# Patient Record
Sex: Female | Born: 1970 | Race: Black or African American | Hispanic: No | Marital: Single
Health system: Midwestern US, Academic
[De-identification: ages and names within clinical notes are randomized; demographics above are authoritative.]

---

## 2019-04-23 NOTE — Telephone Encounter (Signed)
WANTS TO BE A NPV FOR DR Durwin Nora, SAW YOU ON [employer]

## 2019-04-26 NOTE — Telephone Encounter (Signed)
Left message for patient to call and schedule appointment for Dr Durwin Nora. MD approved.

## 2019-04-26 NOTE — Telephone Encounter (Signed)
This is fine to schedule

## 2019-04-27 NOTE — Telephone Encounter (Signed)
Left message on patient voicemail to call and schedule a new patient appointment with Dr Durwin Nora.

## 2019-05-17 ENCOUNTER — Ambulatory Visit: Payer: PRIVATE HEALTH INSURANCE

## 2019-05-17 NOTE — Unmapped (Signed)
left message  for patient to call for rescheduling and how appointent has been canceled option Thursdayor Friday at the end of the day if needed

## 2019-05-21 ENCOUNTER — Ambulatory Visit: Admit: 2019-05-21 | Discharge: 2019-06-02 | Payer: PRIVATE HEALTH INSURANCE

## 2019-05-21 ENCOUNTER — Other Ambulatory Visit: Admit: 2019-05-21 | Payer: PRIVATE HEALTH INSURANCE

## 2019-05-21 DIAGNOSIS — Z0000 Encounter for general adult medical examination without abnormal findings: Secondary | ICD-10-CM

## 2019-05-21 LAB — COMPREHENSIVE METABOLIC PANEL, SERUM
ALT: 12 U/L (ref 7–52)
AST (SGOT): 18 U/L (ref 13–39)
Albumin: 4.4 g/dL (ref 3.5–5.7)
Alkaline Phosphatase: 118 U/L (ref 36–125)
Anion Gap: 6 mmol/L (ref 3–16)
BUN: 10 mg/dL (ref 7–25)
CO2: 30 mmol/L (ref 21–33)
Calcium: 11.1 mg/dL (ref 8.6–10.3)
Chloride: 104 mmol/L (ref 98–110)
Creatinine: 0.78 mg/dL (ref 0.60–1.30)
Glucose: 98 mg/dL (ref 70–100)
Osmolality, Calculated: 289 mOsm/kg (ref 278–305)
Potassium: 4 mmol/L (ref 3.5–5.3)
Sodium: 140 mmol/L (ref 133–146)
Total Bilirubin: 1.5 mg/dL (ref 0.0–1.5)
Total Protein: 6.7 g/dL (ref 6.4–8.9)
eGFR AA CKD-EPI: >90 See note.
eGFR NONAA CKD-EPI: 89 See note.

## 2019-05-21 LAB — CBC
Hematocrit: 40.7 % (ref 35.0–45.0)
Hemoglobin: 13.2 g/dL (ref 11.7–15.5)
MCH: 31.9 pg (ref 27.0–33.0)
MCHC: 32.5 g/dL (ref 32.0–36.0)
MCV: 98 fL (ref 80.0–100.0)
MPV: 10.6 fL (ref 7.5–11.5)
Platelets: 173 10*3/uL (ref 140–400)
RBC: 4.15 10*6/uL (ref 3.80–5.10)
RDW: 13.4 % (ref 11.0–15.0)
WBC: 4.7 10*3/uL (ref 3.8–10.8)

## 2019-05-21 LAB — HPV HIGH RISK WITH GENOTYPING
HPV DNA High Risk Oth: NEGATIVE
HPV Genotype 16: NEGATIVE
HPV Genotype 18: NEGATIVE

## 2019-05-21 LAB — HEMOGLOBIN A1C: Hemoglobin A1C: 5.9 % (ref 4.0–5.6)

## 2019-05-21 LAB — LIPID PANEL
Cholesterol, Total: 187 mg/dL (ref 0–200)
HDL: 85 mg/dL (ref 60–92)
LDL Cholesterol: 90 mg/dL
Triglycerides: 61 mg/dL (ref 10–149)

## 2019-05-21 LAB — HEPATITIS C ANTIBODY: HCV Ab: NONREACTIVE

## 2019-05-21 LAB — HIV 1+2 ANTIBODY/ANTIGEN WITH REFLEX: HIV 1+2 AB/AGN: NONREACTIVE

## 2019-05-21 NOTE — Unmapped (Addendum)
Mammogram [id number])       DASH Eating Plan  DASH stands for Dietary Approaches to Stop Hypertension. The DASH eating plan is a healthy eating plan that has been shown to reduce high blood pressure (hypertension). It may also reduce your risk for type 2 diabetes, heart disease, and stroke. The DASH eating plan may also help with weight loss.  What are tips for following this plan?  General guidelines  ?? Avoid eating more than 2,300 mg (milligrams) of salt (sodium) a day. If you have hypertension, you may need to reduce your sodium intake to 1,500 mg a day.  ?? Limit alcohol intake to no more than 1 drink a day for nonpregnant women and 2 drinks a day for men. One drink equals 12 oz of beer, 5 oz of wine, or 1?? oz of hard liquor.  ?? Work with your health care provider to maintain a healthy body weight or to lose weight. Ask what an ideal weight is for you.  ?? Get at least 30 minutes of exercise that causes your heart to beat faster (aerobic exercise) most days of the week. Activities may include walking, swimming, or biking.  ?? Work with your health care provider or diet and nutrition specialist (dietitian) to adjust your eating plan to your individual calorie needs.  Reading food labels  ?? Check food labels for the amount of sodium per serving. Choose foods with less than 5 percent of the Daily Value of sodium. Generally, foods with less than 300 mg of sodium per serving fit into this eating plan.  ?? To find whole grains, look for the word whole as the first word in the ingredient list.  Shopping  ?? Buy products labeled as low-sodium or no salt added.  ?? Buy fresh foods. Avoid canned foods and premade or frozen meals.  Cooking  ?? Avoid adding salt when cooking. Use salt-free seasonings or herbs instead of table salt or sea salt. Check with your health care provider or pharmacist before using salt substitutes.  ?? Do not fry foods. Cook foods using healthy methods such as baking, boiling, grilling, and  broiling instead.  ?? Cook with heart-healthy oils, such as olive, canola, soybean, or sunflower oil.  Meal planning    ?? Eat a balanced diet that includes:  ?? 5 or more servings of fruits and vegetables each day. At each meal, try to fill half of your plate with fruits and vegetables.  ?? Up to 6-8 servings of whole grains each day.  ?? Less than 6 oz of lean meat, poultry, or fish each day. A 3-oz serving of meat is about the same size as a deck of cards. One egg equals 1 oz.  ?? 2 servings of low-fat dairy each day.  ?? A serving of nuts, seeds, or beans 5 times each week.  ?? Heart-healthy fats. Healthy fats called Omega-3 fatty acids are found in foods such as flaxseeds and coldwater fish, like sardines, salmon, and mackerel.  ?? Limit how much you eat of the following:  ?? Canned or prepackaged foods.  ?? Food that is high in trans fat, such as fried foods.  ?? Food that is high in saturated fat, such as fatty meat.  ?? Sweets, desserts, sugary drinks, and other foods with added sugar.  ?? Full-fat dairy products.  ?? Do not salt foods before eating.  ?? Try to eat at least 2 vegetarian meals each week.  ?? Eat more home-cooked food and less   restaurant, buffet, and fast food.  ?? When eating at a restaurant, ask that your food be prepared with less salt or no salt, if possible.  What foods are recommended?  The items listed may not be a complete list. Talk with your dietitian about what dietary choices are best for you.  Grains  Whole-grain or whole-wheat bread. Whole-grain or whole-wheat pasta. Brown rice. Oatmeal. Quinoa. Bulgur. Whole-grain and low-sodium cereals. Pita bread. Low-fat, low-sodium crackers. Whole-wheat flour tortillas.  Vegetables  Fresh or frozen vegetables (raw, steamed, roasted, or grilled). Low-sodium or reduced-sodium tomato and vegetable juice. Low-sodium or reduced-sodium tomato sauce and tomato paste. Low-sodium or reduced-sodium canned vegetables.  Fruits  All fresh, dried, or frozen fruit. Canned  fruit in natural juice (without added sugar).  Meat and other protein foods  Skinless chicken or turkey. Ground chicken or turkey. Pork with fat trimmed off. Fish and seafood. Egg whites. Dried beans, peas, or lentils. Unsalted nuts, nut butters, and seeds. Unsalted canned beans. Lean cuts of beef with fat trimmed off. Low-sodium, lean deli meat.  Dairy  Low-fat (1%) or fat-free (skim) milk. Fat-free, low-fat, or reduced-fat cheeses. Nonfat, low-sodium ricotta or cottage cheese. Low-fat or nonfat yogurt. Low-fat, low-sodium cheese.  Fats and oils  Soft margarine without trans fats. Vegetable oil. Low-fat, reduced-fat, or light mayonnaise and salad dressings (reduced-sodium). Canola, safflower, olive, soybean, and sunflower oils. Avocado.  Seasoning and other foods  Herbs. Spices. Seasoning mixes without salt. Unsalted popcorn and pretzels. Fat-free sweets.  What foods are not recommended?  The items listed may not be a complete list. Talk with your dietitian about what dietary choices are best for you.  Grains  Baked goods made with fat, such as croissants, muffins, or some breads. Dry pasta or rice meal packs.  Vegetables  Creamed or fried vegetables. Vegetables in a cheese sauce. Regular canned vegetables (not low-sodium or reduced-sodium). Regular canned tomato sauce and paste (not low-sodium or reduced-sodium). Regular tomato and vegetable juice (not low-sodium or reduced-sodium). Pickles. Olives.  Fruits  Canned fruit in a light or heavy syrup. Fried fruit. Fruit in cream or butter sauce.  Meat and other protein foods  Fatty cuts of meat. Ribs. Fried meat. Bacon. Sausage. Bologna and other processed lunch meats. Salami. Fatback. Hotdogs. Bratwurst. Salted nuts and seeds. Canned beans with added salt. Canned or smoked fish. Whole eggs or egg yolks. Chicken or turkey with skin.  Dairy  Whole or 2% milk, cream, and half-and-half. Whole or full-fat cream cheese. Whole-fat or sweetened yogurt. Full-fat cheese.  Nondairy creamers. Whipped toppings. Processed cheese and cheese spreads.  Fats and oils  Butter. Stick margarine. Lard. Shortening. Ghee. Bacon fat. Tropical oils, such as coconut, palm kernel, or palm oil.  Seasoning and other foods  Salted popcorn and pretzels. Onion salt, garlic salt, seasoned salt, table salt, and sea salt. Worcestershire sauce. Tartar sauce. Barbecue sauce. Teriyaki sauce. Soy sauce, including reduced-sodium. Steak sauce. Canned and packaged gravies. Fish sauce. Oyster sauce. Cocktail sauce. Horseradish that you find on the shelf. Ketchup. Mustard. Meat flavorings and tenderizers. Bouillon cubes. Hot sauce and Tabasco sauce. Premade or packaged marinades. Premade or packaged taco seasonings. Relishes. Regular salad dressings.  Where to find more information:  ?? National Heart, Lung, and Blood Institute: www.nhlbi.nih.gov  ?? American Heart Association: www.heart.org  Summary  ?? The DASH eating plan is a healthy eating plan that has been shown to reduce high blood pressure (hypertension). It may also reduce your risk for type 2 diabetes, heart   disease, and stroke.  ?? With the DASH eating plan, you should limit salt (sodium) intake to 2,300 mg a day. If you have hypertension, you may need to reduce your sodium intake to 1,500 mg a day.  ?? When on the DASH eating plan, aim to eat more fresh fruits and vegetables, whole grains, lean proteins, low-fat dairy, and heart-healthy fats.  ?? Work with your health care provider or diet and nutrition specialist (dietitian) to adjust your eating plan to your individual calorie needs.
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
## 2020-03-17 NOTE — Unmapped (Signed)
[phone] (home)     Patient has been notified to go to urgent care for eval.  Her response was OK

## 2020-03-17 NOTE — Unmapped (Signed)
Aniceto Boss called in requesting an antibiotic for a possible sinus infection. Pt states that she has had them in the past and these symptoms are very similar. Pt with sinus pressure, nasal congestion and yellow nasal drainage. See assessment below.     Pt advised that no appt are available at this time. Pt advised that a message would be sent over to the clinic. Pt advised that if symptoms worsen or does not hear back from clinic that is best she seen an Urgent care for symptoms.    Please call pt back at: [phone]      Reason for Disposition  ??? Using nasal washes and pain medicine > 24 hours and sinus pain (lower forehead, cheekbone, or eye) persists    Answer Assessment - Initial Assessment Questions  1. LOCATION: Where does it hurt?       Around the nose,  2. ONSET: When did the sinus pain start?  (e.g., hours, days)       2 days ago  3. SEVERITY: How bad is the pain?   (Scale 1-10; mild, moderate or severe)    - MILD (1-3): doesn't interfere with normal activities     - MODERATE (4-7): interferes with normal activities (e.g., work or school) or awakens from sleep    - SEVERE (8-10): excruciating pain and patient unable to do any normal activities         6/10  4. RECURRENT SYMPTOM: Have you ever had sinus problems before? If so, ask: When was the last time? and What happened that time?       Yes, and states feels similar   5. NASAL CONGESTION: Is the nose blocked? If so, ask, Can you open it or must you breathe through the mouth?      One nare is blocked at a time   6. NASAL DISCHARGE: Do you have discharge from your nose? If so ask, What color?      Clear to yellow   7. FEVER: Do you have a fever? If so, ask: What is it, how was it measured, and when did it start?       Denies   8. OTHER SYMPTOMS: Do you have any other symptoms? (e.g., sore throat, cough, earache, difficulty breathing)      Back teeth pain, sore throat   9. PREGNANCY: Is there any chance you are pregnant? When  was your last menstrual period?      Denies    Protocols used: SINUS PAIN AND CONGESTION-A-[facility]

## 2020-07-11 NOTE — Unmapped (Signed)
[phone] (home)   No relevant phone numbers on file.         Called patient to give reminder that mammogram is overdue and [facility] Zenaida Niece will be at [facility] from 1-3 pm on Wednesday 07/12/2020.    There was no answer, so left a message asking for call back.     To schedule call [phone].

## 2021-07-02 IMAGING — OT DXA BONE DENSITY
2 series · 2 of 2 positions shown · non-contrast
Comparison: none

REASON FOR EXAM: Evaluate bone density.

RISK FACTORS:  None
PRIOR EXAMS:  None
METHOD:  Scans of the spine and hip were performed using dual energy X-ray densitometry (DXA).

[Series 1: — · 1 of 1 slices shown (1 of 2)]
[im 1/1]
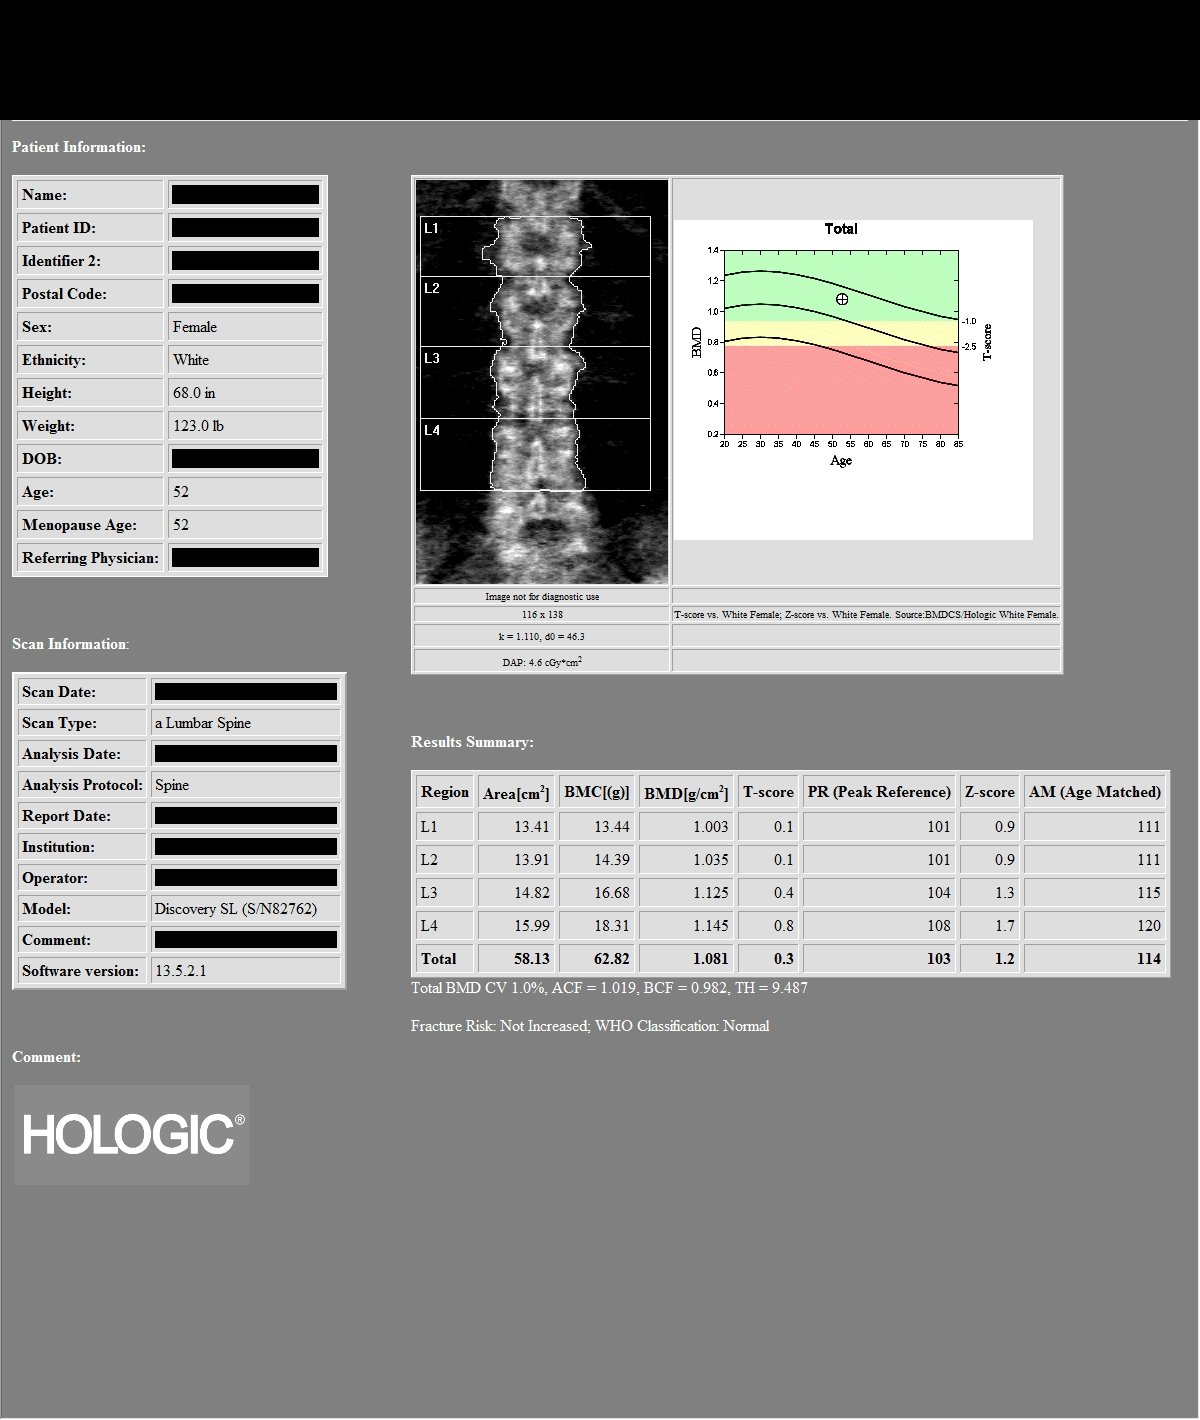

[Series 2: — · left · 1 of 1 slices shown (2 of 2)]
[im 1/1]
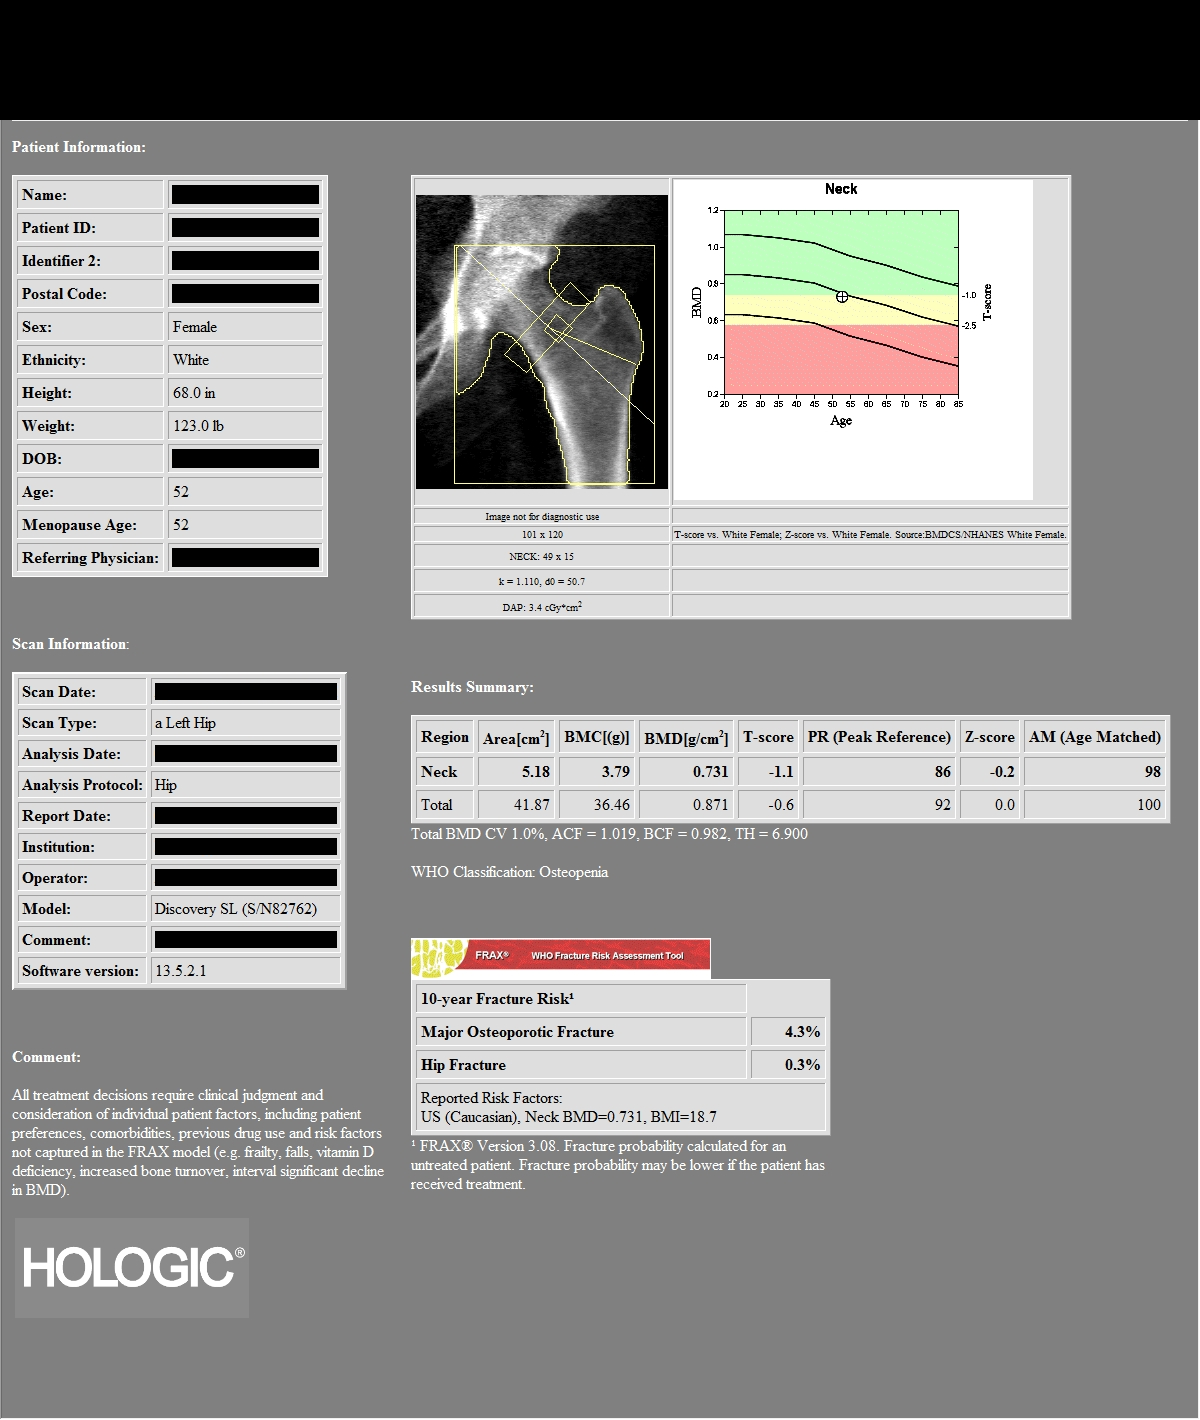

[2 of 2 positions shown; findings below may reference images not displayed]

IMPRESSION: As defined by World Health Organization, the patient meets the criteria for OSTEOPENIA based on left hip T-scores.

PATIENT DEMOGRAPHICS:  50 years old White  Female.
FINDINGS: 1.    Review of scanogram images shows no factor invalidating scan results.  

2.    The lumbar spine exam using L1-L4 regions shows average Bone Mineral Density is 1.081 gm/cm2 of Hydroxyapatite.  The T-score (comparing patient with a young adult group) is 0.3 standard deviations ABOVE mean. The Z-score (comparing patient with an age-matched group) is 1.2 standard deviations ABOVE mean.

3.  The left hip exam using femoral neck region of interest shows average Bone Mineral Density is 0.731 gm/cm2 of Hydroxyapatite. The T-score (comparing patient with a young adult group) is 1.1 standard deviations BELOW mean. The Z-score (comparing patient with an age-matched group) is 0.2 standard deviations BELOW mean.

According to the World Health Organization risk assessment tool (FRAX) for osteopenia only, which uses the femoral neck T score and includes other patient risk factors for fracture, the patient has a 10-year absolute risk of hip fracture of 0.3% and 10-year absolute risk fracture for any major fracture of 4.3%. Clinicians judgment and/or patient preferences may indicate treatment for people with 10-year fracture probabilities above or below these levels.

RECOMMENDATIONS:  The patient states that she is not taking supplements on a regular basis.  The patient should continue being a nonsmoker and regular exercise to patient tolerance would be of benefit.  The patient is currently not taking prescribed medication for prevention of bone loss.  According to criteria established by the National Osteoporosis Foundation, the patient DOES NOT meet the current indications for prescribed medical therapy.  The National Osteoporosis Foundation now recommends followup DXA scanning every two years in patients at risk regardless of whether the patient is undergoing pharmacological treatment.

World Health Organization criteria for diagnosis, please see link below.

[URL]

## 2021-07-02 IMAGING — MG MAMMO SCRN BIL W/CAD TOMO
8 series · 8 of 24 positions shown · non-contrast
Comparison: The present examination has been compared to prior imaging studies.

Images Obtained from [facility]
INDICATION: Screening.
TECHNIQUE: Bilateral 2-D digital screening mammogram was performed followed by 3-D tomosynthesis.  Current study was also evaluated with a computer aided detection (CAD) system.
MAMMOGRAM FINDINGS:
The breasts are almost entirely fatty.
No suspicious abnormality is seen in either breast.  There are no significant changes from the prior study.

[R MLO]
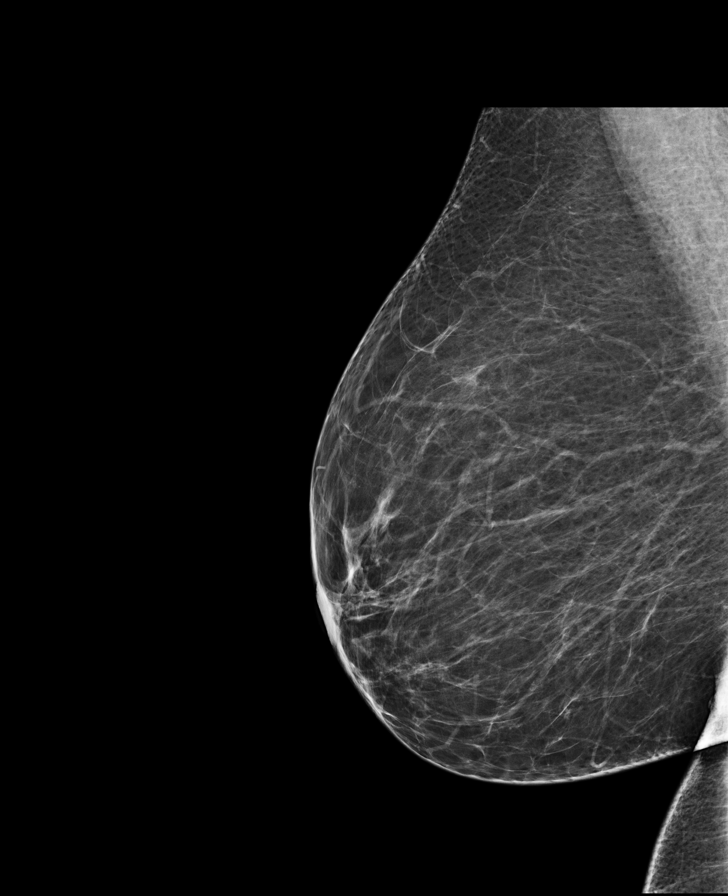

[L CC]
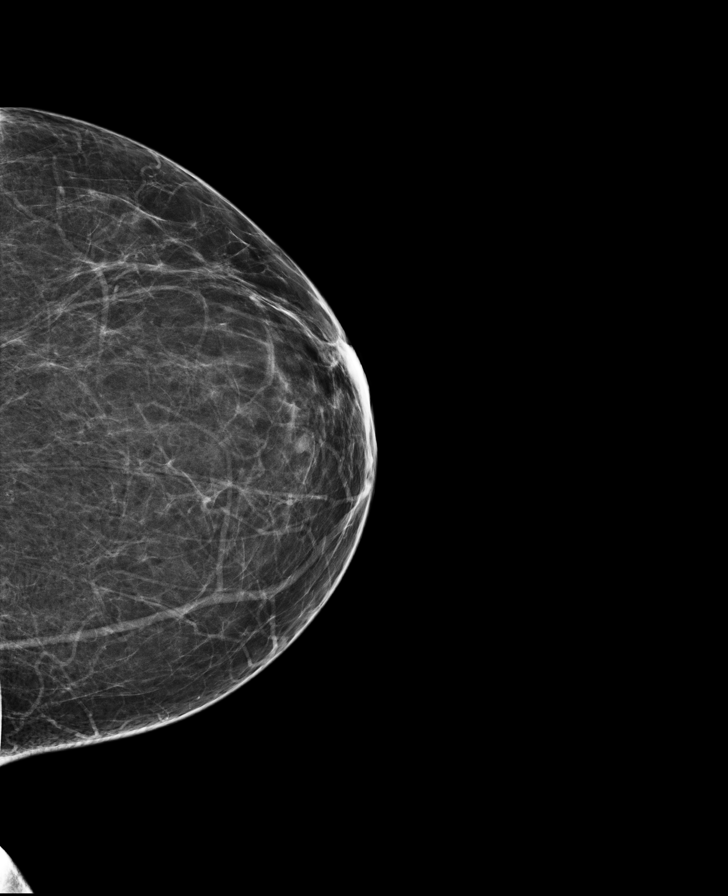

[R CC]
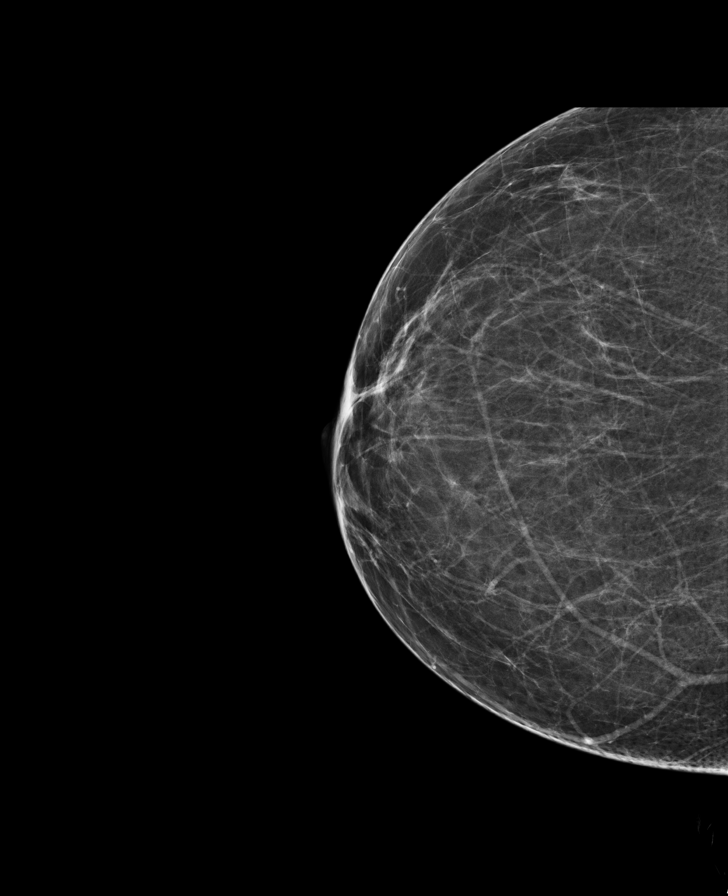

[L MLO]
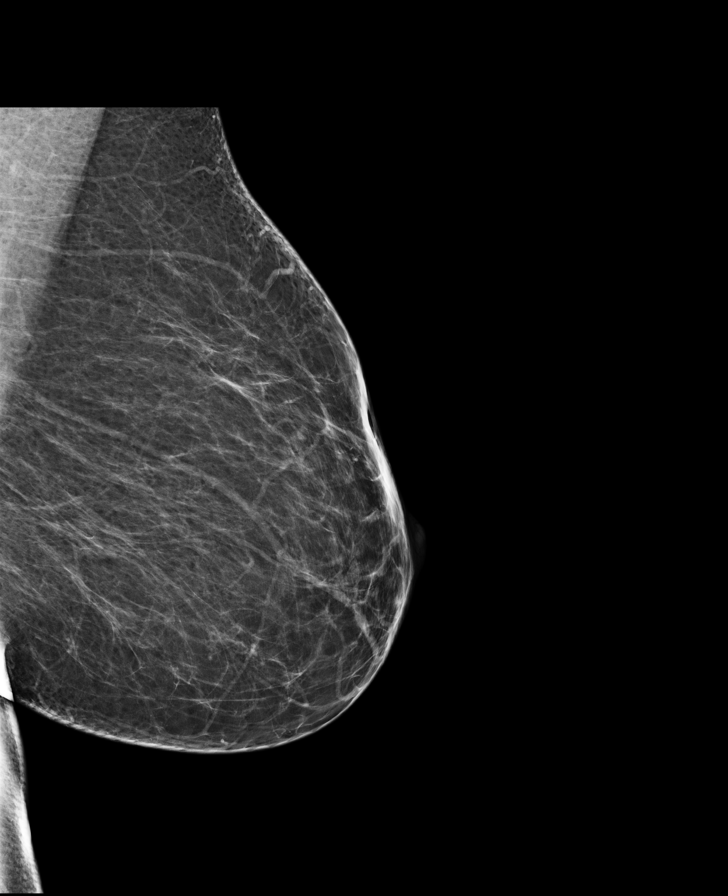

[R CC tomo · tomo slice 33/66.0]
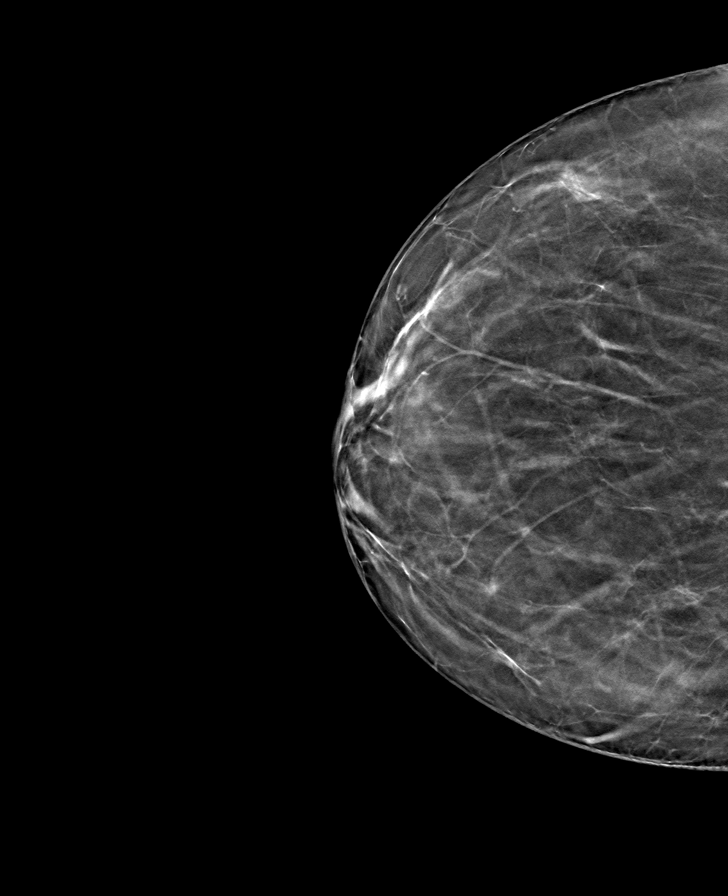

[L MLO tomo · tomo slice 39/77.0]
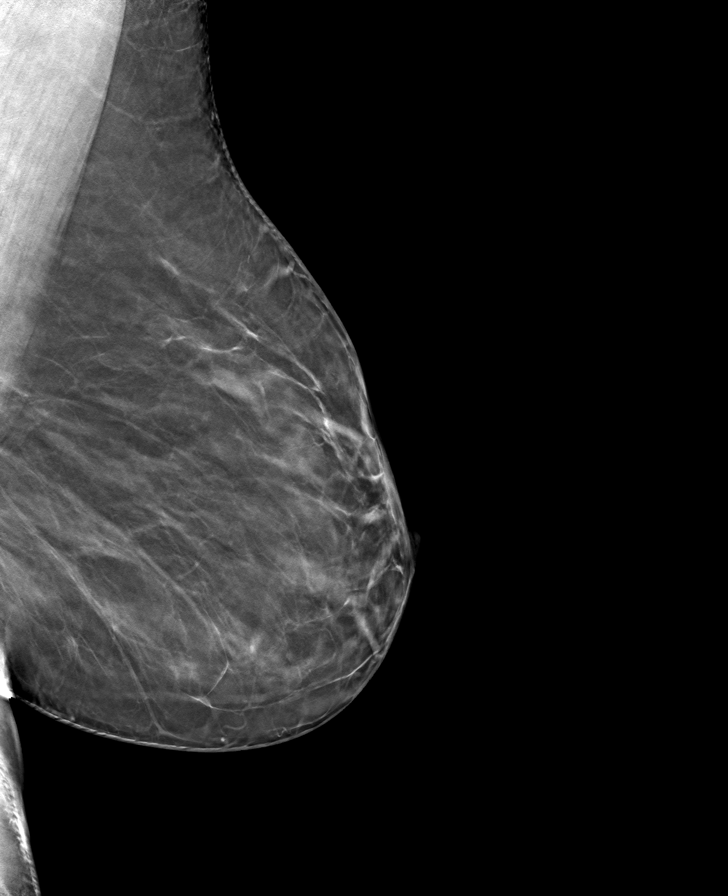

[R MLO tomo · tomo slice 40/79.0]
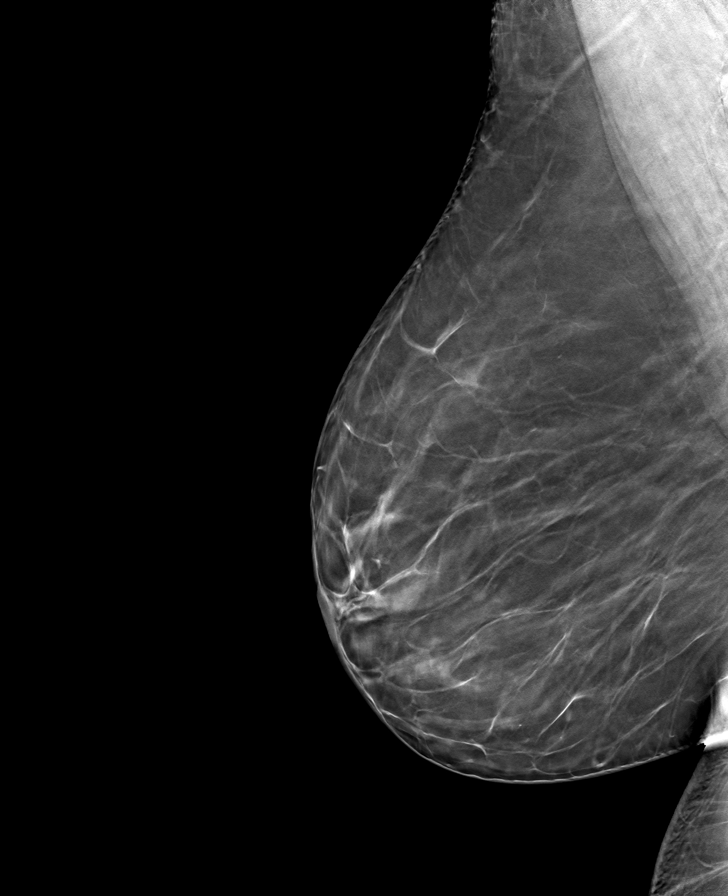

[L CC tomo · tomo slice 33/65.0]
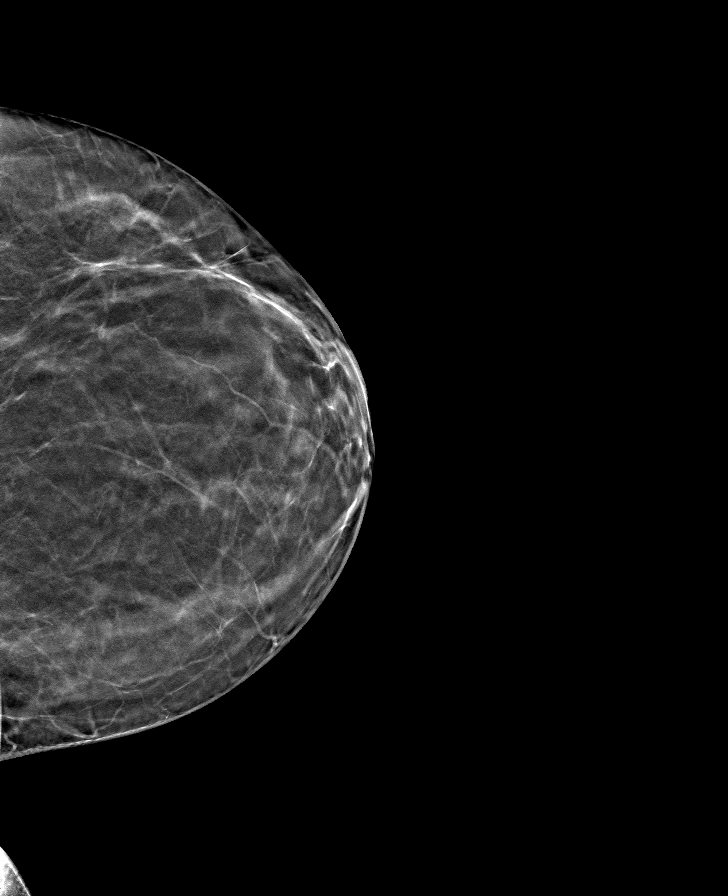

[8 of 24 positions shown; findings below may reference images not displayed]

IMPRESSION: There is no mammographic evidence of malignancy.
Screening mammogram recommended in 1 year.
BI-RADS Category 1: Negative

## 2021-07-02 IMAGING — MR MRI BRAIN WITH IAC WO/W CONTRAST
10 of 13 series · 34 of 48 positions shown · IV contrast (20cc prohance)
Comparison: None.

HISTORY: 50-year-old female with sensorineural hearing loss, unilateral, right ear, with unrestricted hearing on the contralateral side.
TECHNIQUE: Multiplanar, multisequence MRI images of the brain are obtained prior to and following intravenous contrast. IAC protocol was performed.

CONTRAST: 20 mL of ProHance

[Series 1: bSSFP · axial · 4.0mm · 0.94mm/px · z∈[-20,+121]mm · 4 of 25 slices shown]
[im 1/25]
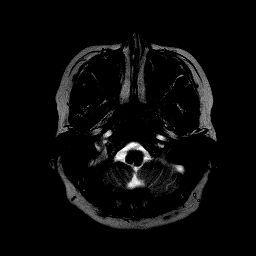
[im 9/25]
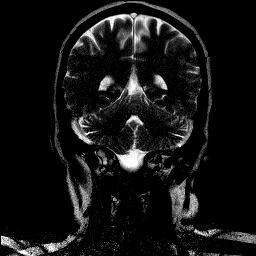
[im 17/25]
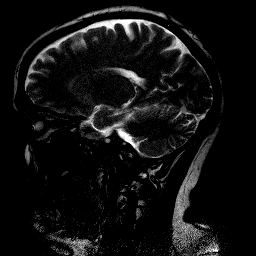
[im 25/25]
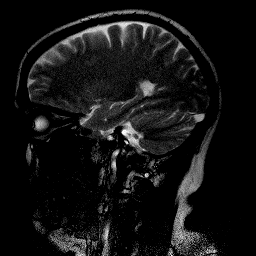

[Series 2: t1_sag · sagittal · 5.0mm · 0.75mm/px · 3 of 22 slices shown]
[im 1/22]
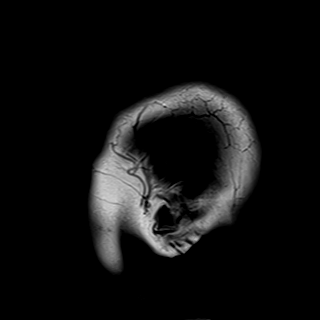
[im 11/22]
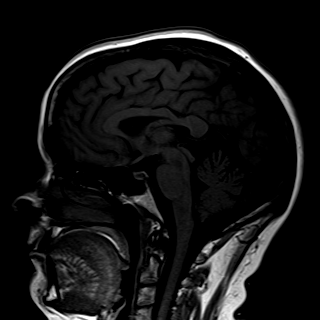
[im 22/22]
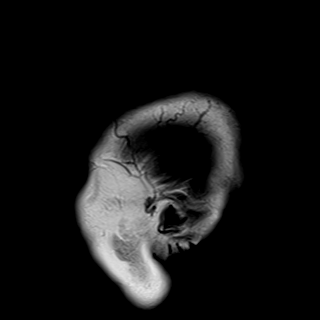

[Series 3: flair_axial_fs · axial · 5.0mm · 0.45mm/px · z∈[-48,+95]mm · 3 of 24 slices shown]
[im 1/24]
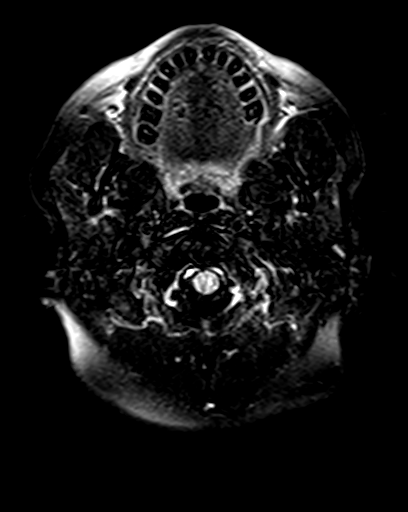
[im 12/24]
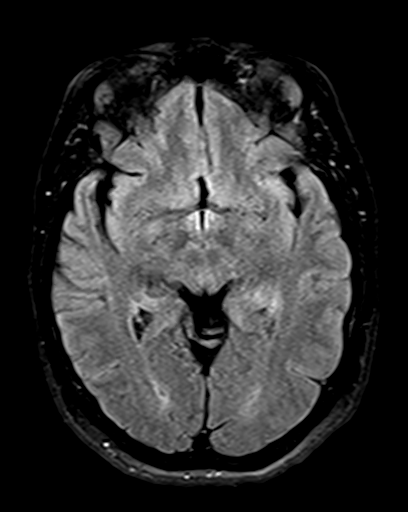
[im 24/24]
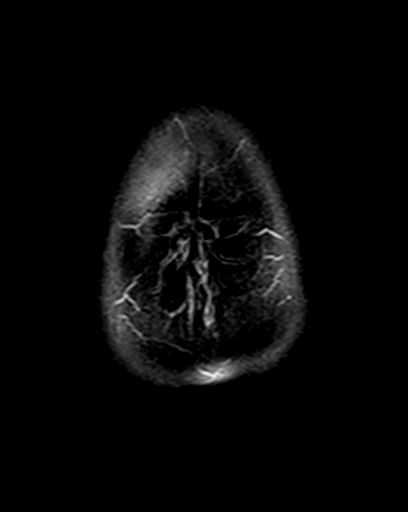

[Series 4: DWI · axial · 5.0mm · 1.26mm/px · z∈[-48,+95]mm · 3 of 24 slices shown (1 of 2)]
[im 1/24]
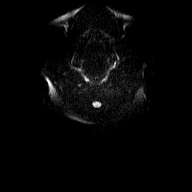
[im 12/24]
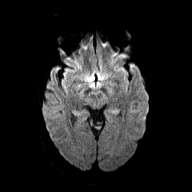
[im 24/24]
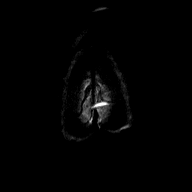

[Series 5: DWI · axial · 5.0mm · 1.26mm/px · z∈[-48,+95]mm · 3 of 24 slices shown (2 of 2)]
[im 1/24]
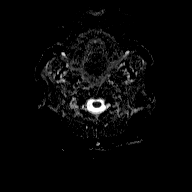
[im 12/24]
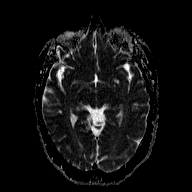
[im 24/24]
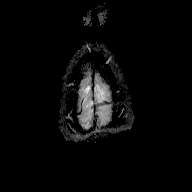

[Series 6: t1_axial · axial · 5.0mm · 0.72mm/px · z∈[-48,+95]mm · 3 of 24 slices shown]
[im 1/24]
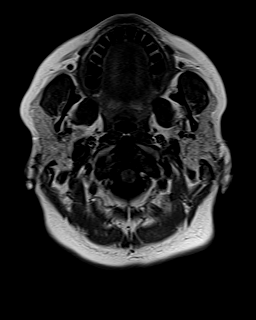
[im 12/24]
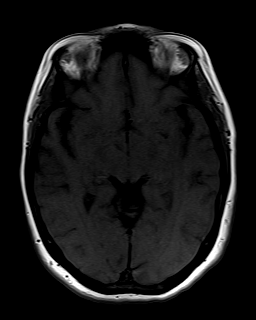
[im 24/24]
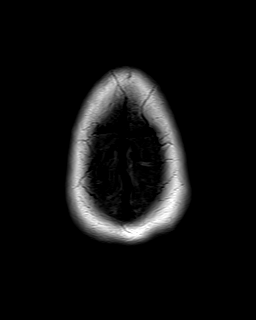

[Series 7: flash_axial · axial · 5.0mm · 0.45mm/px · z∈[-48,+95]mm · 3 of 24 slices shown]
[im 1/24]
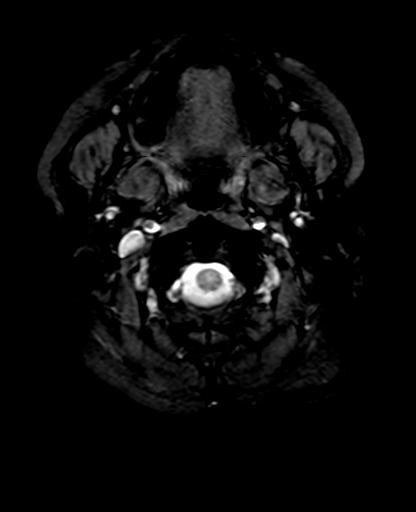
[im 12/24]
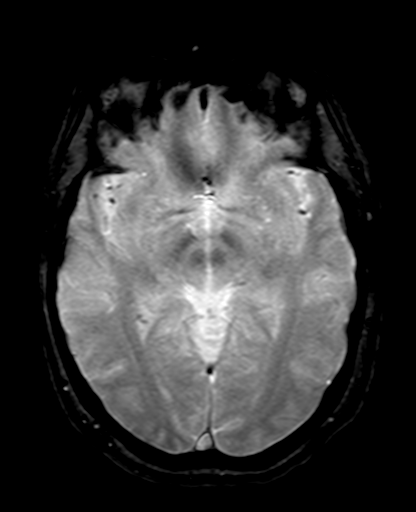
[im 24/24]
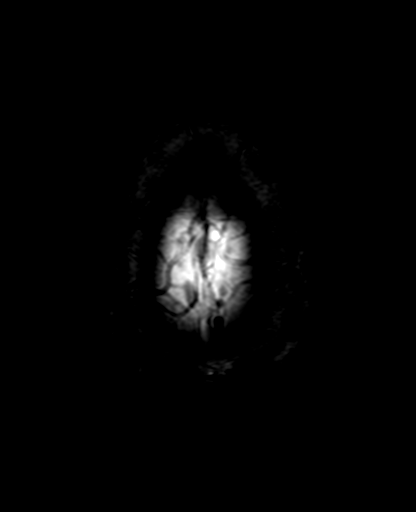

[Series 8: t2_axial_fs · axial · 0.8mm · 0.37mm/px · z∈[-21,+6]mm · 5 of 36 slices shown]
[im 1/36]
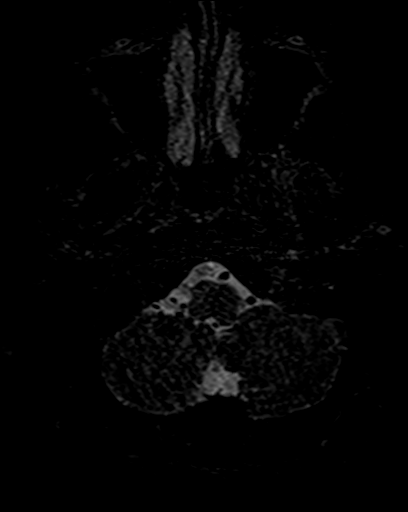
[im 9/36]
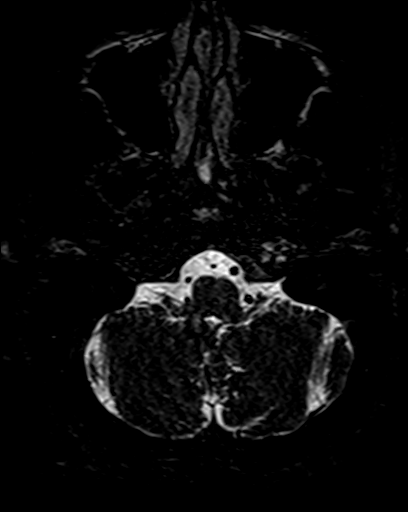
[im 18/36]
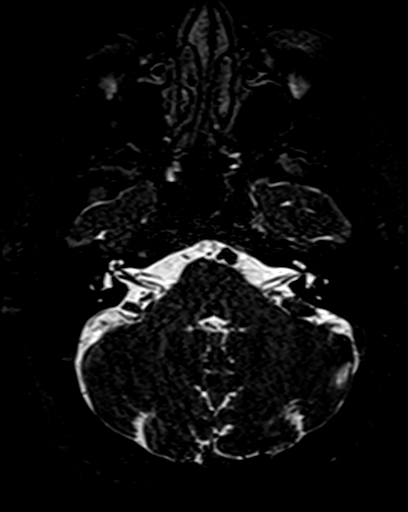
[im 27/36]
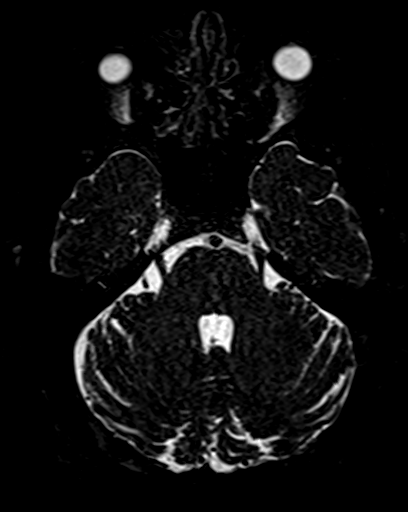
[im 36/36]
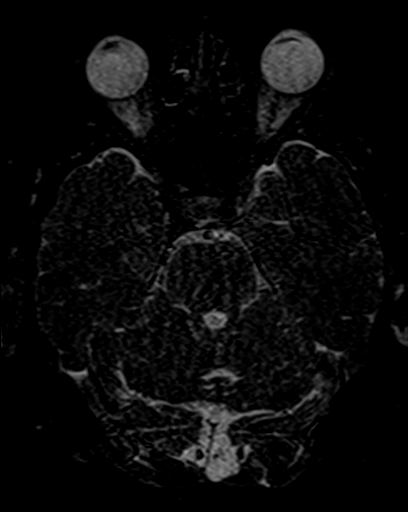

[Series 9: t2_fs_cor_reformat · coronal · 1.5mm · 0.33mm/px · 3 of 20 slices shown]
[im 1/20]
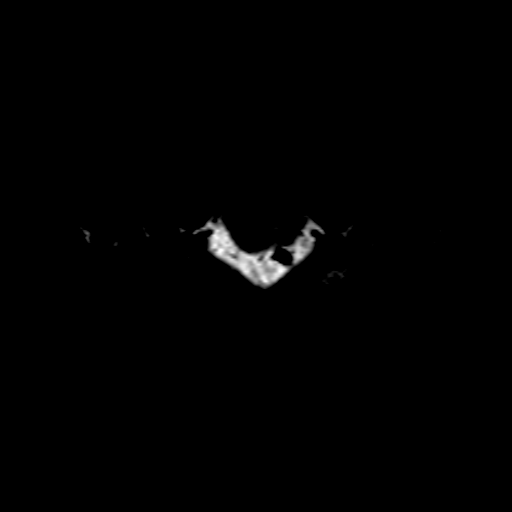
[im 10/20]
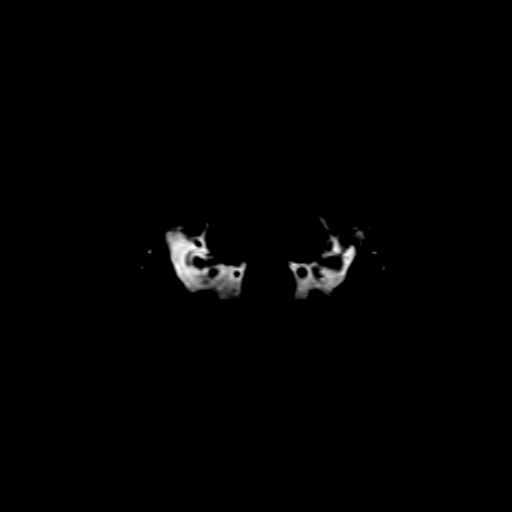
[im 20/20]
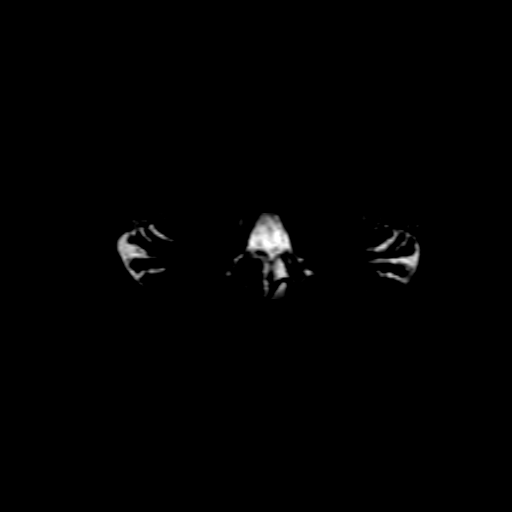

[Series 10: t1_vibe_axial_fs · axial · 1.0mm · 0.39mm/px · z∈[-25,+1]mm · 4 of 36 slices shown]
[im 1/36]
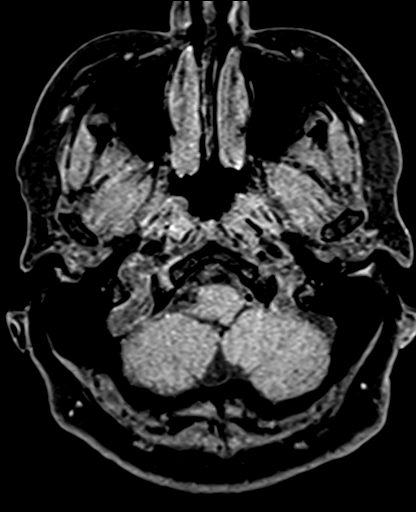
[im 9/36]
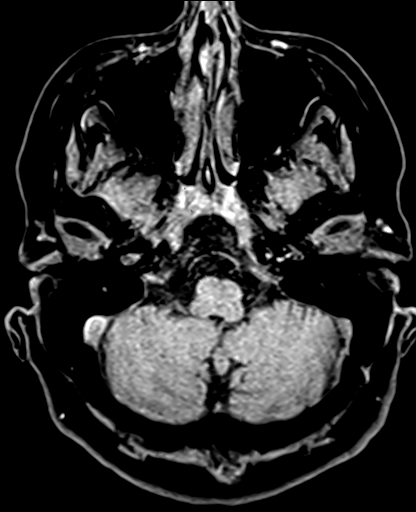
[im 18/36]
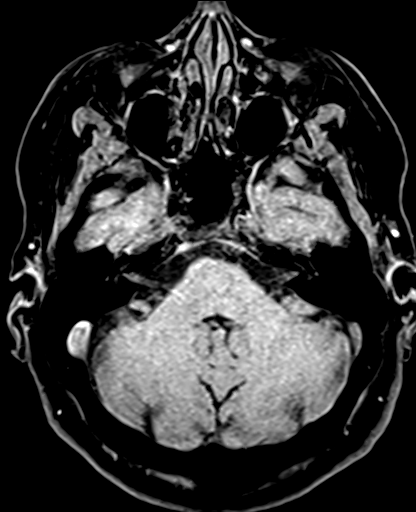
[im 27/36]
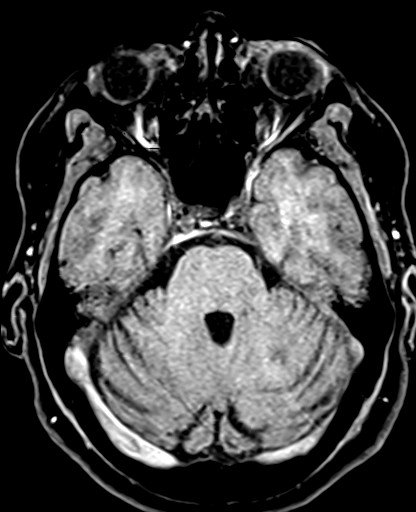

[34 of 48 positions shown; findings below may reference images not displayed]

FINDINGS: EXTERNAL AND MIDDLE EAR CAVITIES: Normal in signal without presence of fluid.

INNER EAR STRUCTURES: Normal in architecture and signal. No abnormal enhancement.

INTERNAL AUDITORY CANAL: No masses or gross nerve thickening.

BRAIN PARENCHYMA: No acute infarct. Supratentorial white matter T2/FLAIR hyperintensities are nonspecific but statistically most likely to be related to chronic microangiopathic/senescent changes. No abnormal intracranial enhancement.

VENTRICULAR SYSTEM: No hydrocephalus or midline shift.

EXTRA-AXIAL SPACES: No intra- or extra-axial fluid collections.

VASCULAR STRUCTURES: Expected major intracranial flow voids are present.

PARANASAL SINUSES AND MASTOID AIR CELLS: Predominantly clear
IMPRESSION: No visualized etiology for hearing loss.

## 2022-01-02 IMAGING — MR MRI KNEE RT WO CONTRAST
4 of 5 series · 16 of 40 positions shown · non-contrast
Comparison: None available.

Images Obtained from [facility]
INDICATION: Pain in right knee
TECHNIQUE: Multiplanar, multisequence imaging of the right knee was performed without contrast.

[Series 2: t2_axial_fs · axial · 4.0mm · 0.23mm/px · z∈[-63,+42]mm · 7 of 26 slices shown]
[im 1/26]
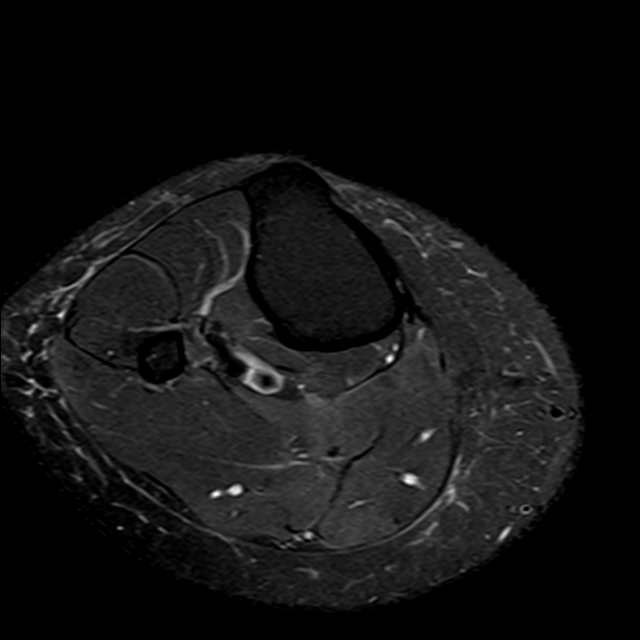
[im 4/26]
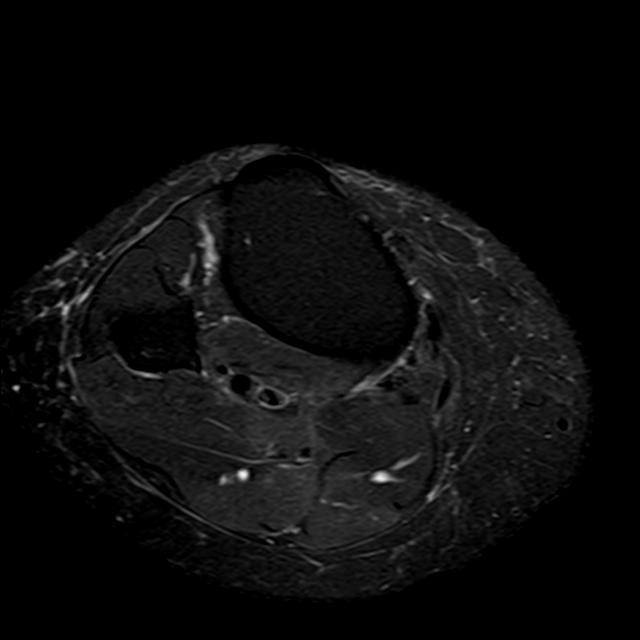
[im 8/26]
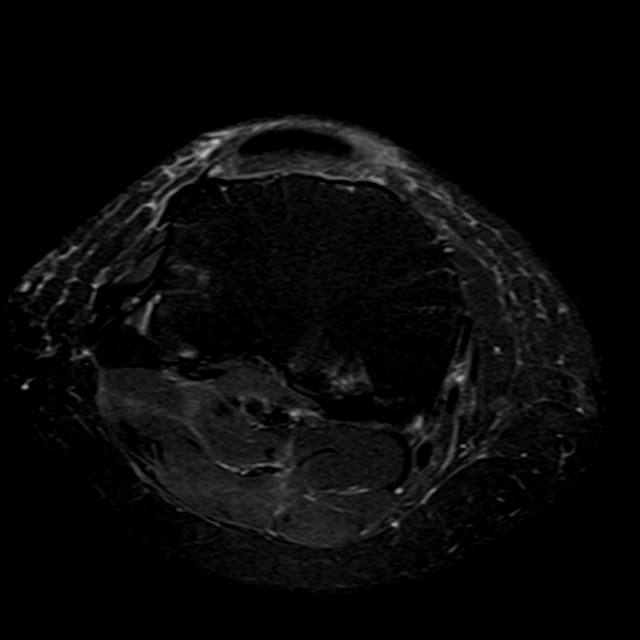
[im 11/26]
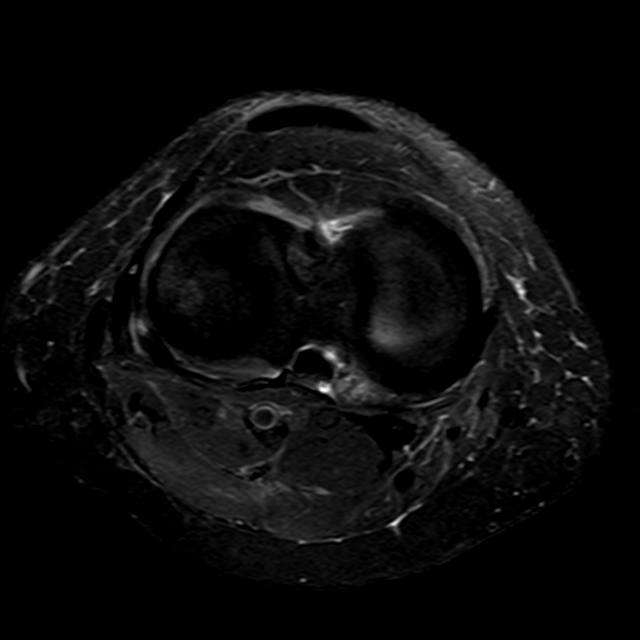
[im 15/26]
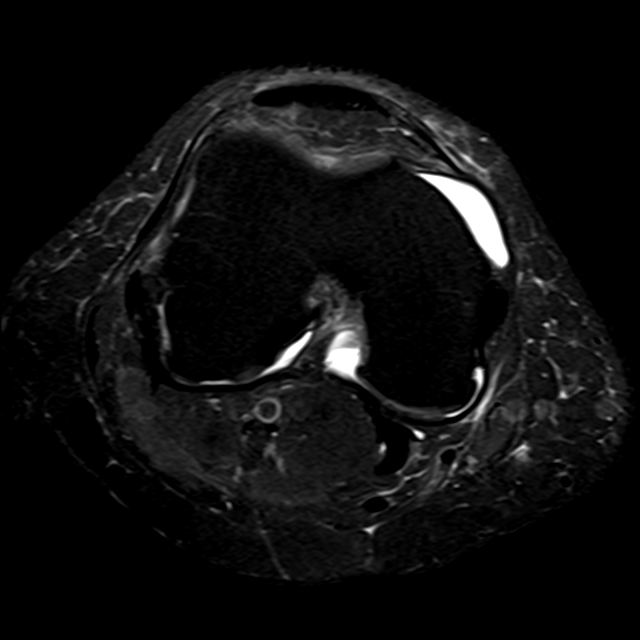
[im 18/26]
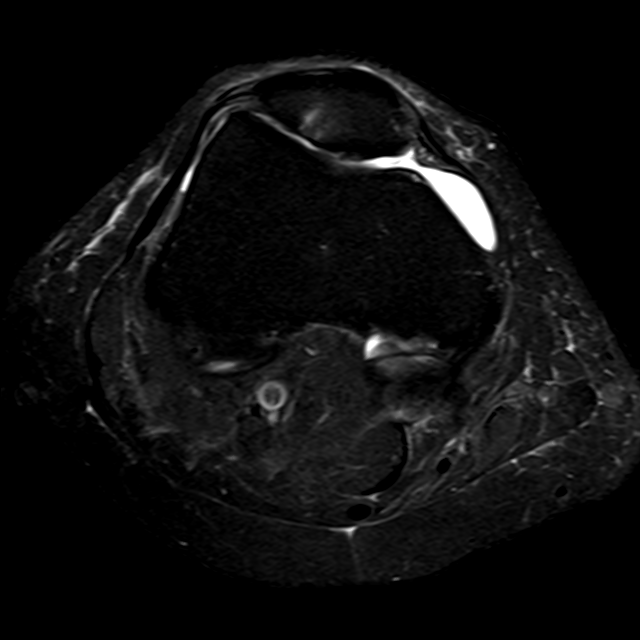
[im 22/26]
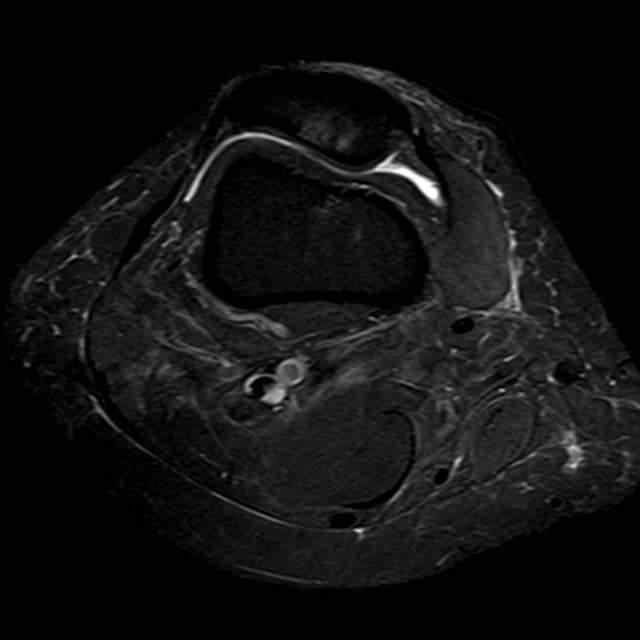

[Series 3: pd_sag_fs · sagittal · 3.0mm · 0.29mm/px · 3 of 27 slices shown]
[im 4/27]
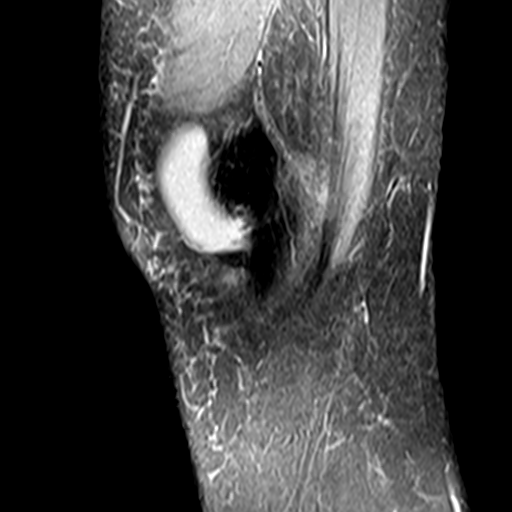
[im 14/27]
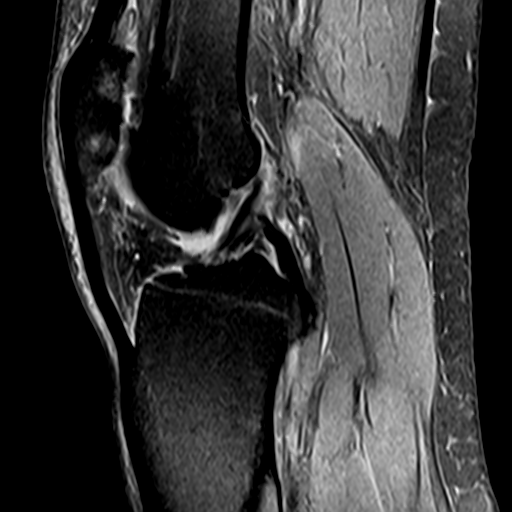
[im 23/27]
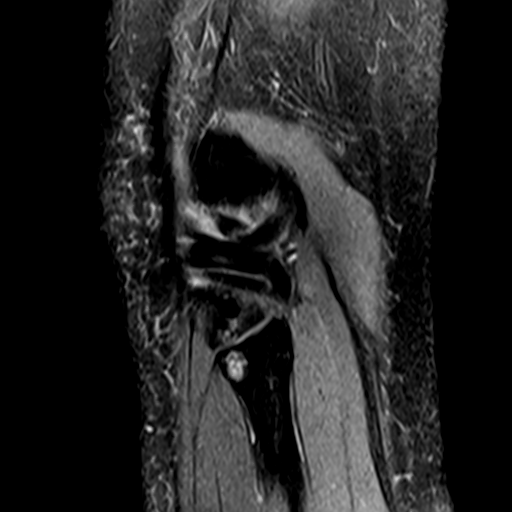

[Series 4: t2_sag_fs · sagittal · 3.0mm · 0.29mm/px · 3 of 27 slices shown]
[im 4/27]
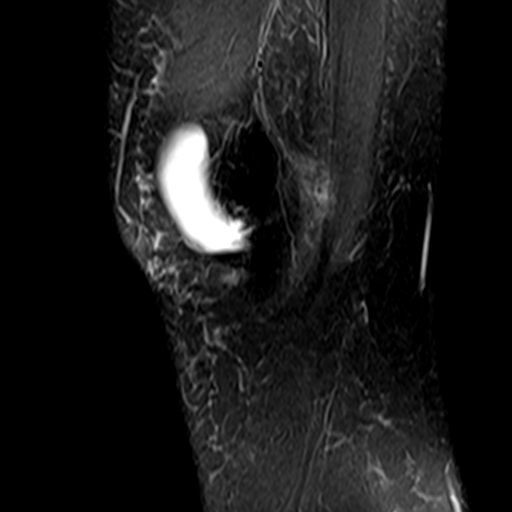
[im 14/27]
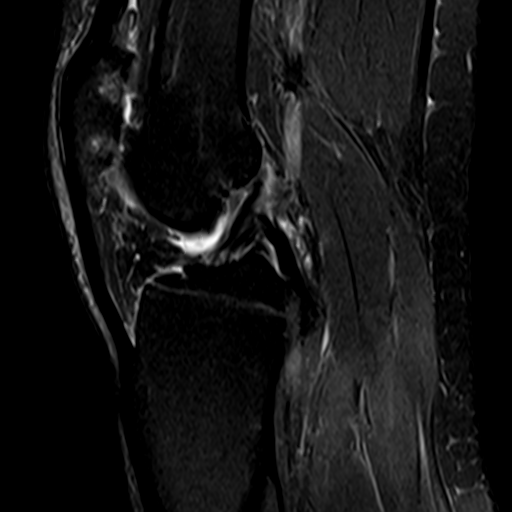
[im 23/27]
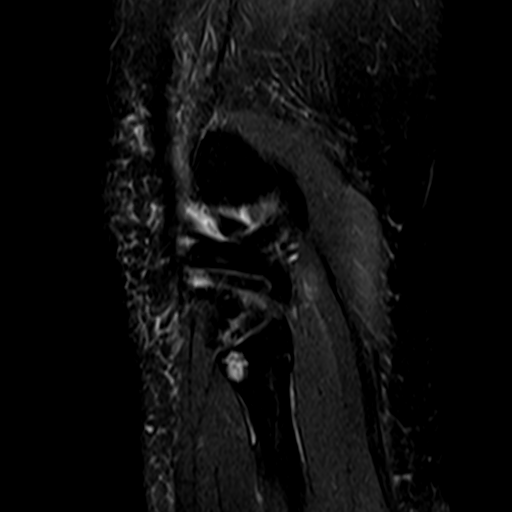

[Series 5: t1_cor · coronal · 4.0mm · 0.47mm/px · 3 of 22 slices shown]
[im 4/22]
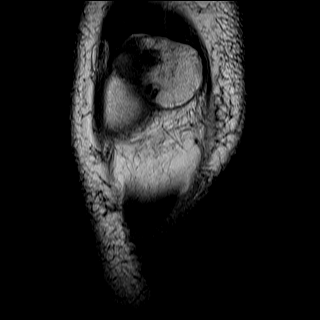
[im 11/22]
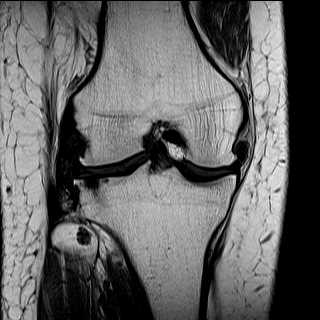
[im 18/22]
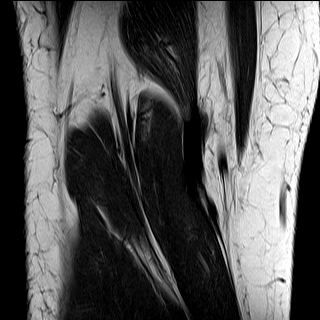

[16 of 40 positions shown; findings below may reference images not displayed]

FINDINGS: OSSEOUS: No acute fracture. 8 mm lobulated T2 hyperintense structure within the proximal fibular metaphysis, most compatible with a low-grade chondroid series lesion, such as an enchondroma.
MEDIAL COMPARTMENT:
Medial meniscus: Myxoid degeneration of the posterior horn and body, with linear intermediate signal intensity contacting the inferior articular surface of the body on a single image (series 6, image
10). Partial extrusion of the medial meniscal body in the medial gutter.
Articular cartilage: Mild to moderate chondral thinning and irregularity of the mid and peripheral portion of the weightbearing medial femoral condyle without underlying reactive osseous change or
focal full-thickness chondral defect. Small marginal osteophytes.
LATERAL COMPARTMENT:
Lateral meniscus: Oblique inferior surface tear of the peripheral aspect of the posterior horn. Small horizontal tear of the body.
Articular cartilage: Moderate lateral compartment chondrosis, including high-grade chondrosis of the peripheral portion of the weightbearing lateral tibial plateau, adjacent to the lateral meniscal
body. Associated underlying subchondral cystic change and marrow edema is present.
PATELLOFEMORAL JOINT AND EXTENSOR MECHANISM:
Quadriceps tendon: The visualized portion of the distal quadriceps tendon is intact.
Patella tendon: Intact.
Articular cartilage: Large region of high-grade chondrosis involving the median ridge and lateral patellar facet, with underlying subchondral cystic change and marrow edema. Deep partial-thickness
chondral fissure of the inferior portion of the medial patellar facet, without underlying reactive subchondral marrow edema.
Alignment: The alignment of the patellofemoral joint is within normal limits, in the imaged position.
LIGAMENTS:
Anterior cruciate ligament: Intact.
Posterior cruciate ligament: Intact.
Medial collateral ligament: Intact.
Lateral collateral ligament: Intact.
MUSCULOTENDINOUS: The visualized musculotendinous soft tissues about the knee are unremarkable.
OTHER: Small knee joint effusion. Trace fluid is present within the popliteal recess.
IMPRESSION: 1.  Myxoid degeneration of the posterior horn and body of the medial meniscus, with possible small inferior surface tear of the body. Mild to moderate medial compartment chondrosis.
2.  Small inferior surface tear of the posterior horn lateral meniscus. Moderate lateral compartment chondrosis.
3.  Moderate patellofemoral joint chondrosis.
4.  Small knee joint effusion.

## 2022-01-04 IMAGING — CR HIP W/PELVIS RT 2-3 VWS MIN
3 series · 3 of 3 positions shown · non-contrast
Comparison: None.

Images Obtained from [facility]
Right hip radiographs with pelvis, 3 views
INDICATION: Sprain in ligaments of lumbar spine, trochanteric bursitis of right hip.

[t pelvis ap]
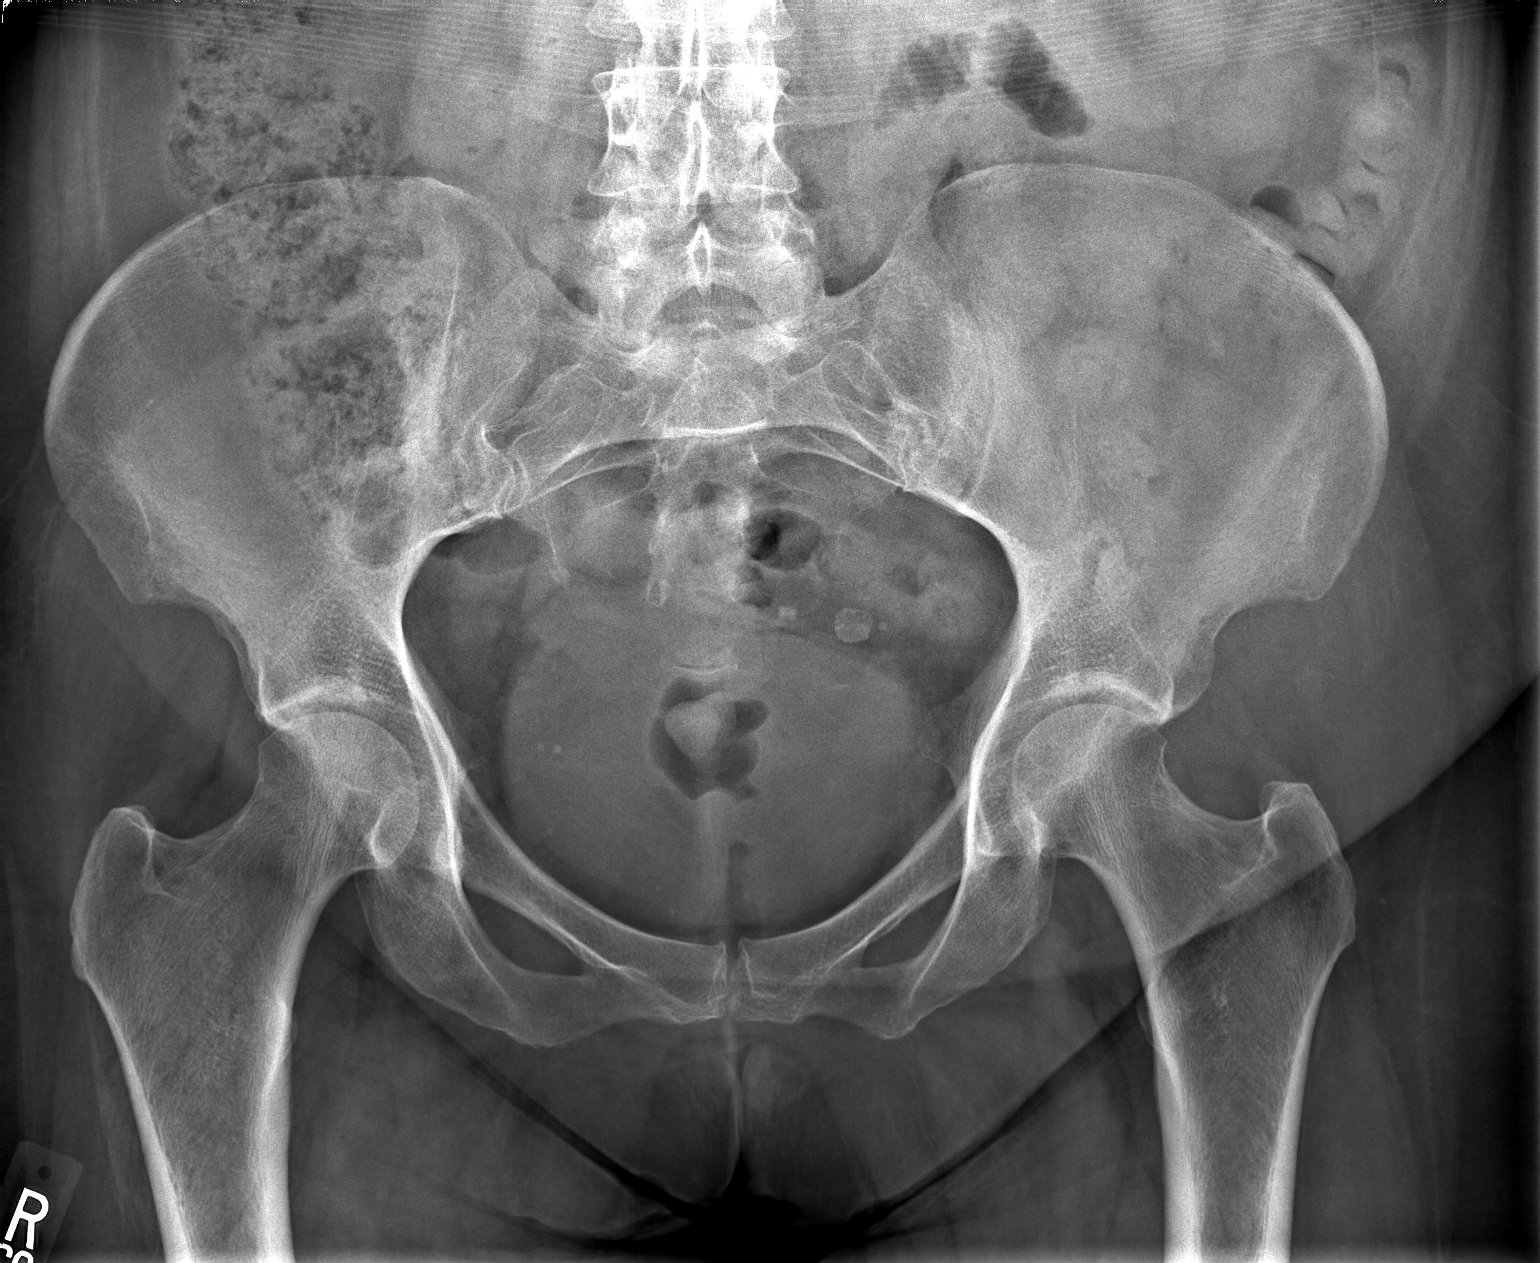

[t hip frog right (1 of 2)]
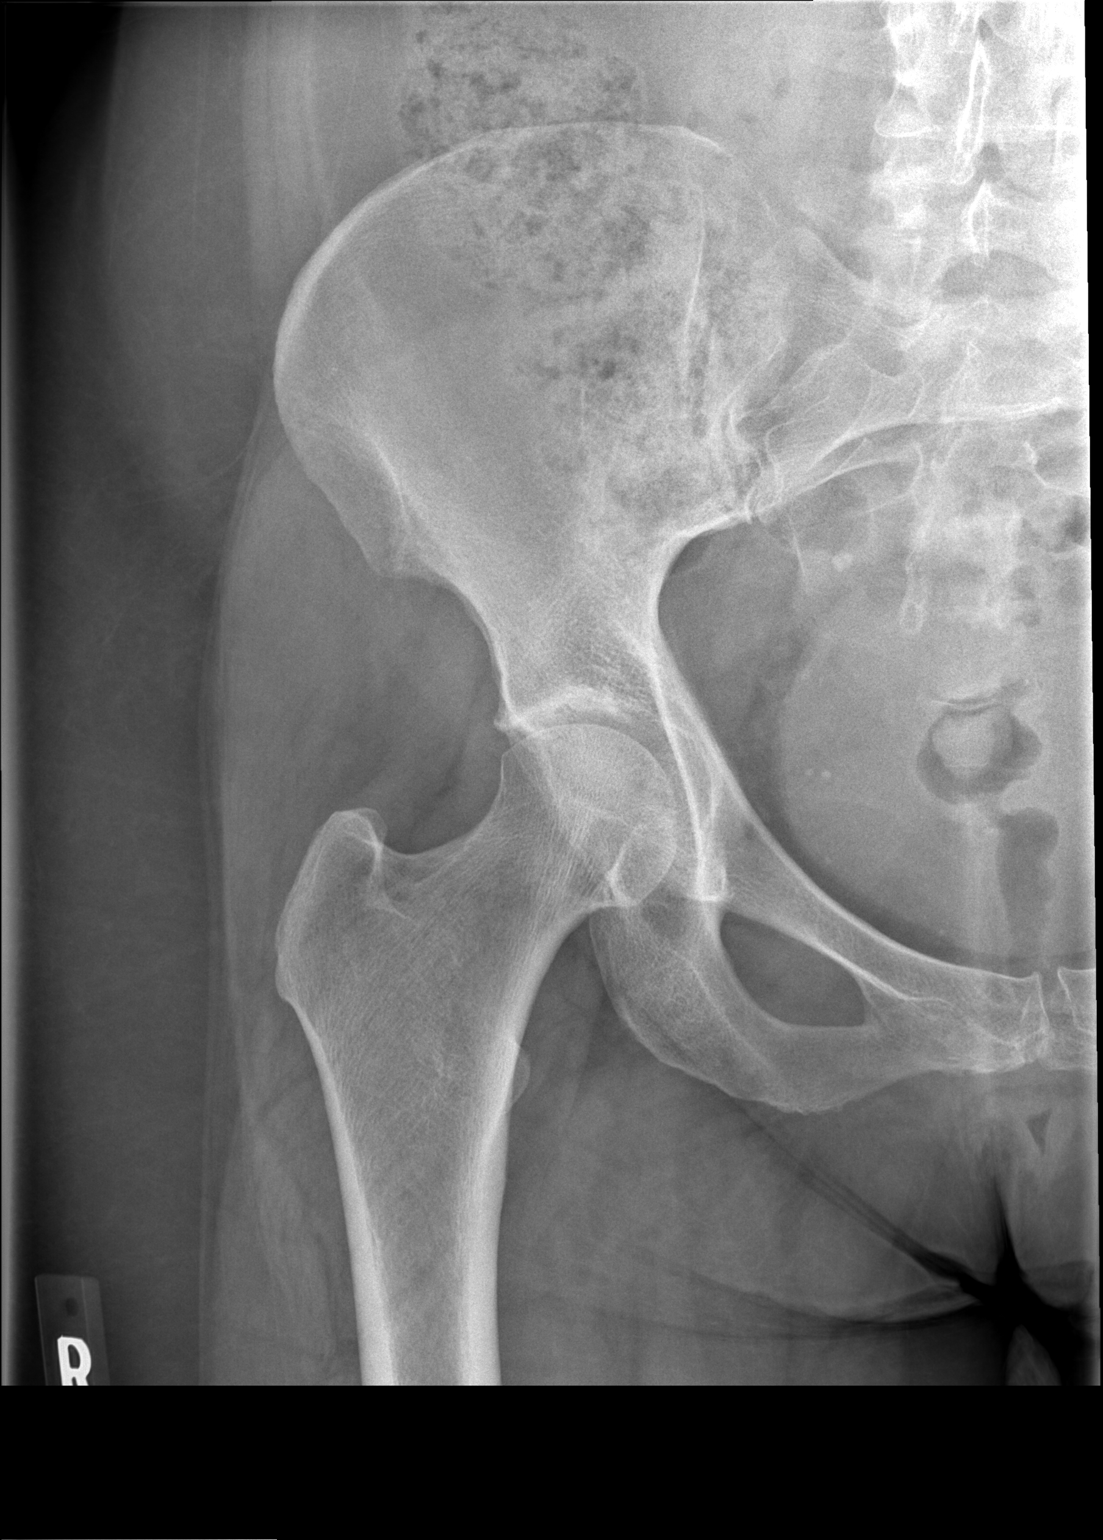

[t hip frog right (2 of 2)]
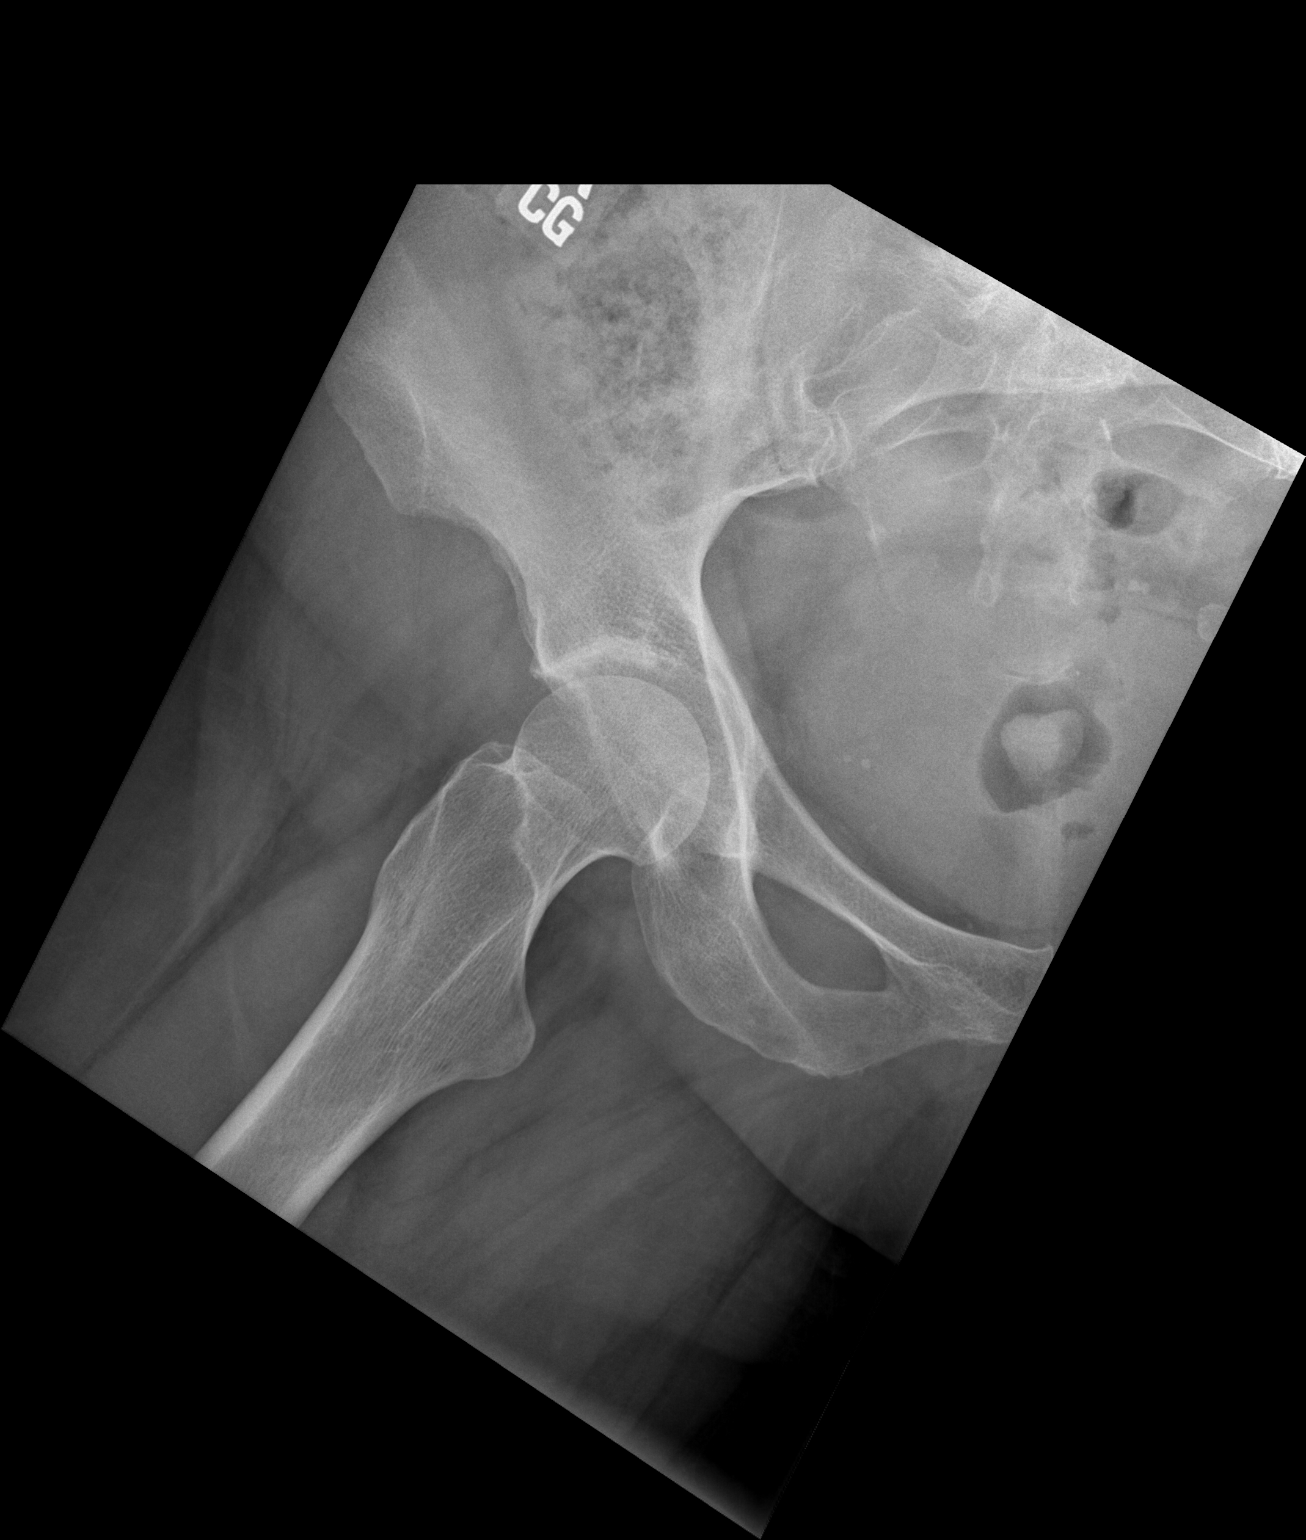

[3 of 3 positions shown; findings below may reference images not displayed]

FINDINGS: No acute fracture. No dislocation. Intact right hip joint. Left hip is intact. Intact SI joints and pubic symphysis. Soft tissues are unremarkable. Mild lower lumbar spine degenerative changes.
IMPRESSION: Unremarkable right hip and pelvis radiographs.

## 2022-08-08 IMAGING — CT CT CARDIAC CALCIUM SCORING
1 of 2 series · 11 of 20 positions shown, 14 images · non-contrast
Comparison: None

Images Obtained from [facility]
REASON FOR EXAM: Pure hypercholsterolemia  unspecified
TECHNIQUE: Multiple helical CT images of the heart were acquired without the administration of IV contrast. Post processing software quantified calcium (Threshold 130 HU) in the coronary arteries
with Agatston scores. Coronary screening is most appropriately used in asymptomatic patients with clinical risk factors for coronary atherosclerosis. Dose reduction technique used: Automated exposure
control and adjustment of the mA and/or kV according to patient size. CT Studies and Cardiac Nuclear Medicine Studies in last 12-months = 0
Total radiation dose to patient is CTDIvol 13.60 mGy and DLP 214.00 mGy-cm.
NUMBER OF CALCIFIED CORONARY ARTERY PLAQUES
Left Main: 0
Left Anterior Descending: 0
Circumflex: 0
Right Coronary Artery: 0
Total: 0
CORONARY ARTERY CALCIUM SCORE
Right Coronary Artery and PDA: 0
TOTAL AGATSTON SCORE: 0
OTHER FINDINGS: Mild linear atelectasis/scarring at the left lung base.

[Series 2: calcium score · axial · 0.48mm/px · z∈[-1229,-1127]mm · 11 of 82 slices shown, 14 images]
[im 7/82  vessel]
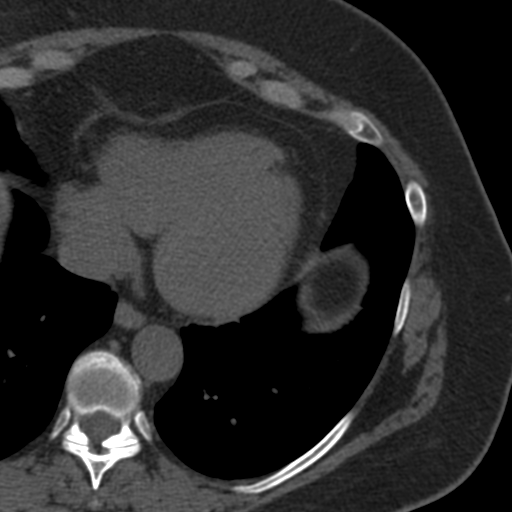
[im 7/82  lung]
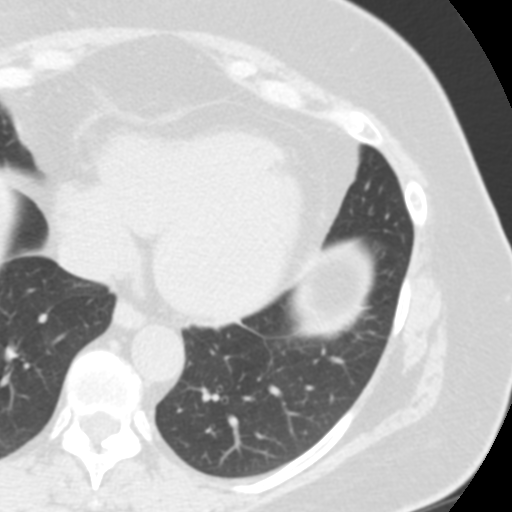
[im 14/82  vessel]
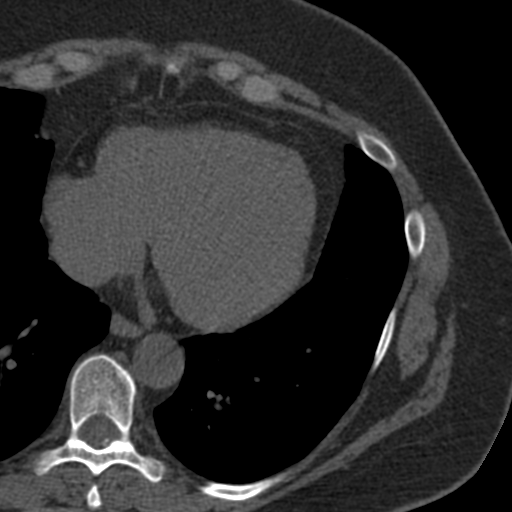
[im 21/82  vessel]
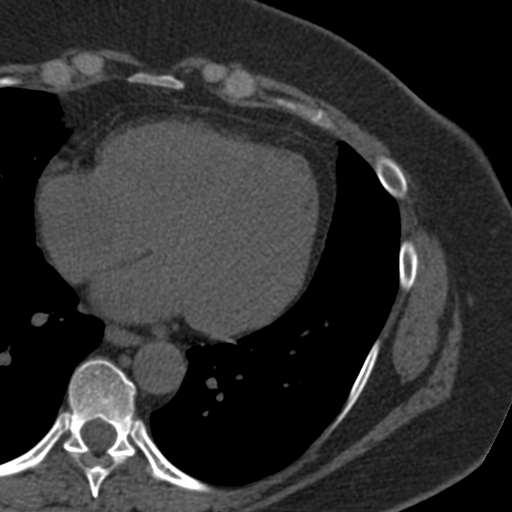
[im 28/82  vessel]
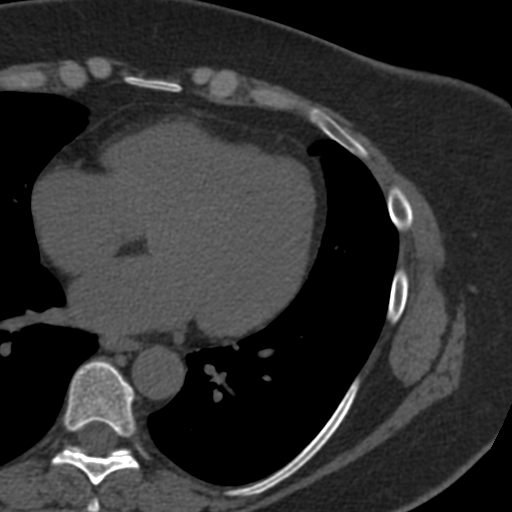
[im 34/82  vessel]
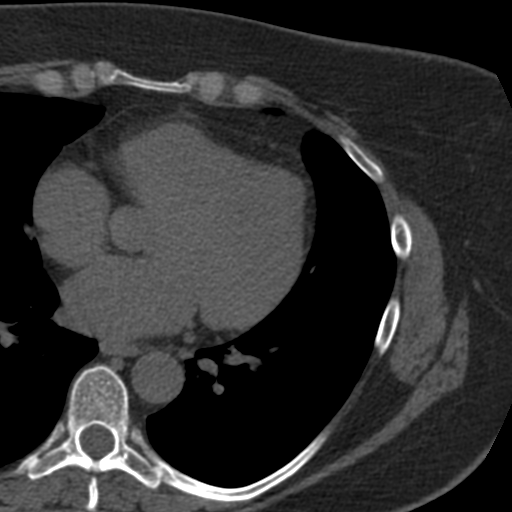
[im 34/82  lung]
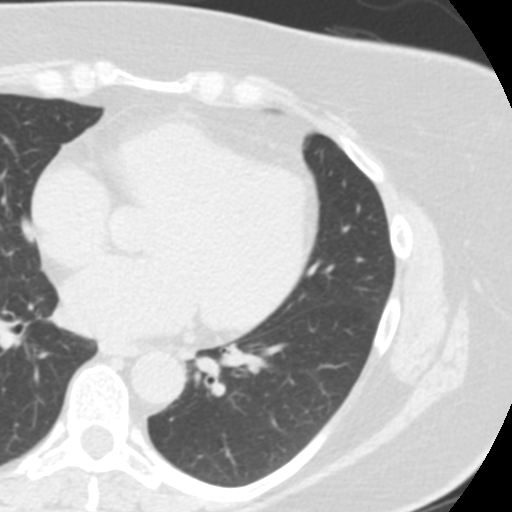
[im 41/82  vessel]
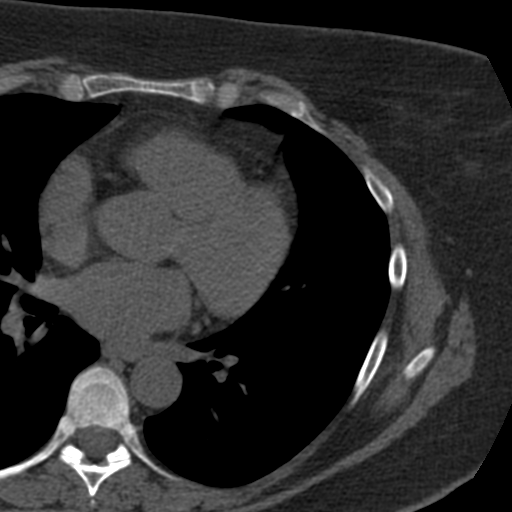
[im 48/82  vessel]
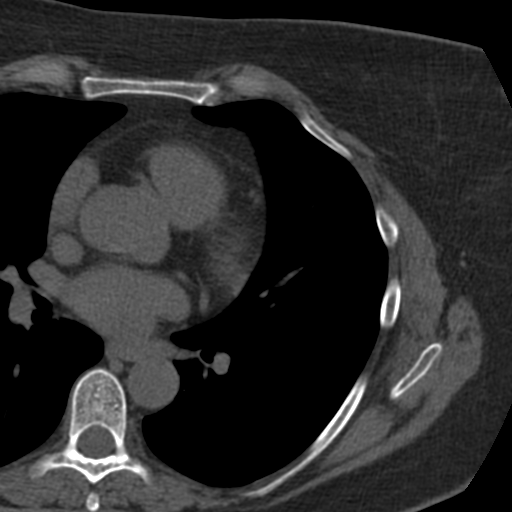
[im 55/82  vessel]
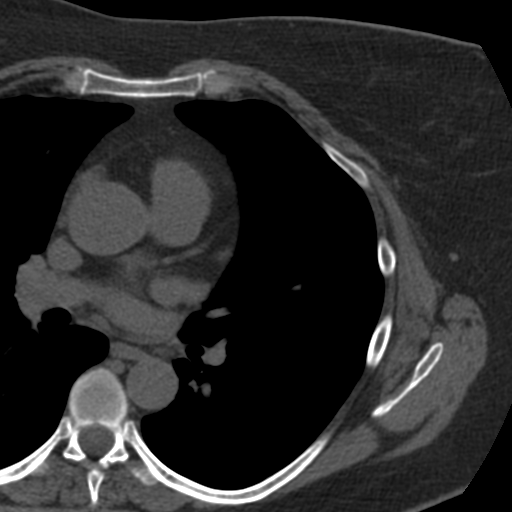
[im 61/82  vessel]
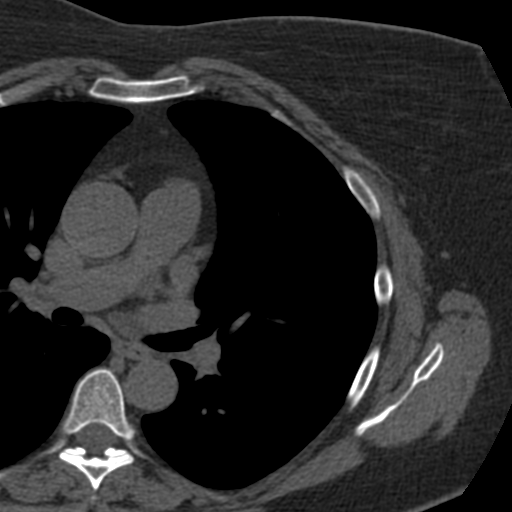
[im 61/82  lung]
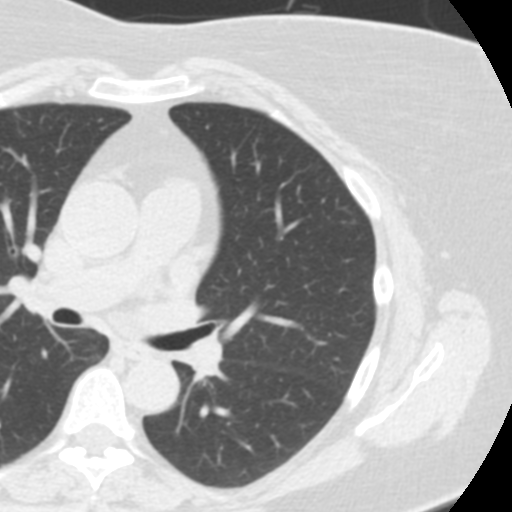
[im 68/82  vessel]
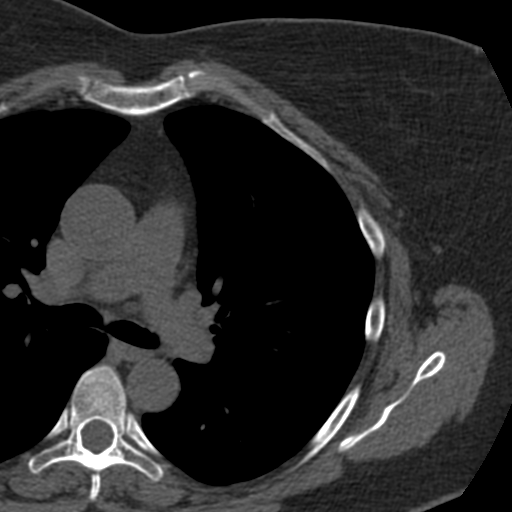
[im 75/82  vessel]
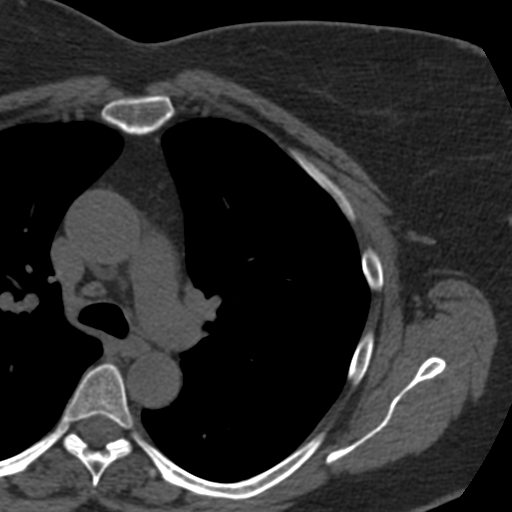

[11 of 20 positions shown; findings below may reference images not displayed]

IMPRESSION: 1. Examination is technically satisfactory.
2. Agatston score 0
3. No significant miscellaneous findings are noted.
REFERENCE TABLE
*  0: No evidence of CAD (very low risk of future coronary event)
*  1-10: Minimal CAD (low risk of future coronary event)Consider evaluation for risk factors and managing according to primary prevention guidelines.
*  11-100: Mild CAD (increased risk of future coronary event). Consider evaluation for risk factors and managing according to primary prevention guidelines.
*  101-400: Moderate CAD (relative lifetime risk of coronary event is 4.3 times a patient with a score of 0)*. Consider evaluation for risk factors and managing according to primary prevention
guidelines.
*  533-7333: Severe CAD (relative lifetime risk of coronary event is 7.2 times a patient with a score of 0)*. Consider evaluation for risk factors and managing according to primary prevention
guidelines.
*  >5111 (relative lifetime risk of coronary event is 10.8 times a patient with a score of 0)*.  Consider evaluation for risk factors and managing according to primary prevention guidelines.
*Yachu, et al, Clinical expert consensus document on coronary calcium screening, J Am Coll Cardiology. 3449; [DATE]
Clinical risk factors can be combined with Agatston coronary calcium score to determine 10 year risk for coronary event using MESA risk calculator at:

## 2022-08-08 IMAGING — MG MAMMO SCRN BIL W/CAD TOMO
8 series · 8 of 24 positions shown · non-contrast
Comparison: The present examination has been compared to prior imaging studies.

Images Obtained from [facility]
INDICATION: Screening.
TECHNIQUE: Bilateral 2-D digital screening mammogram was performed followed by 3-D tomosynthesis.  Current study was also evaluated with a computer aided detection (CAD) system.
MAMMOGRAM FINDINGS:
The breasts are almost entirely fatty.
No suspicious abnormality is seen in either breast.  There are no significant changes from the prior study.

[R MLO]
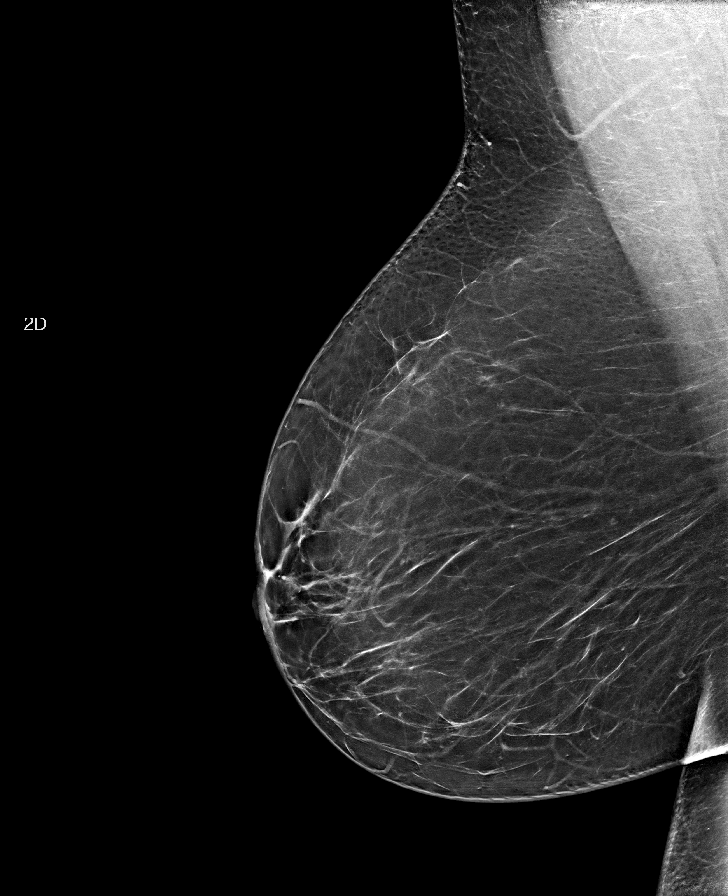

[L MLO]
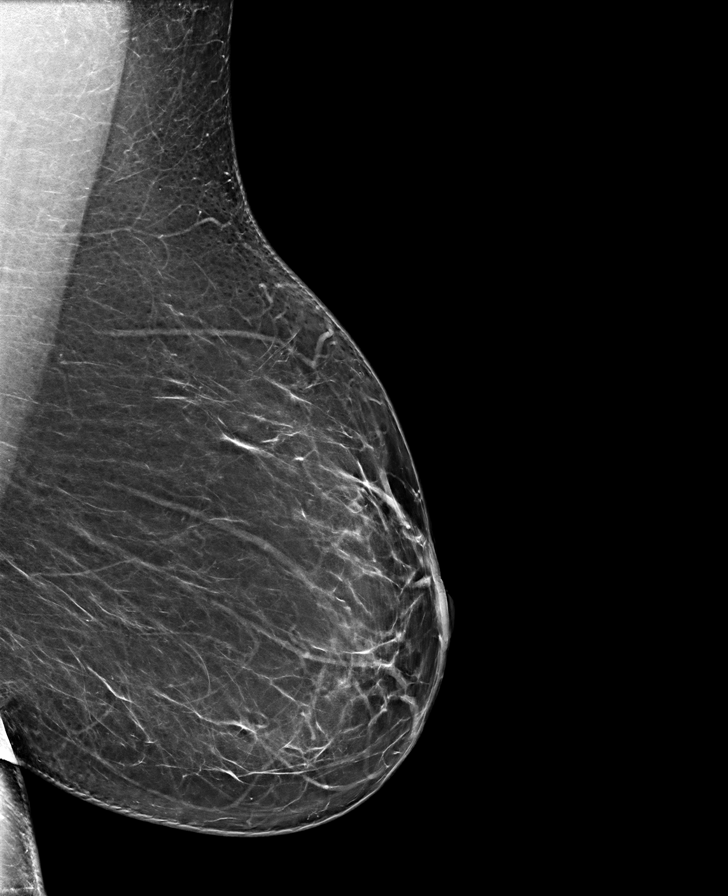

[L CC]
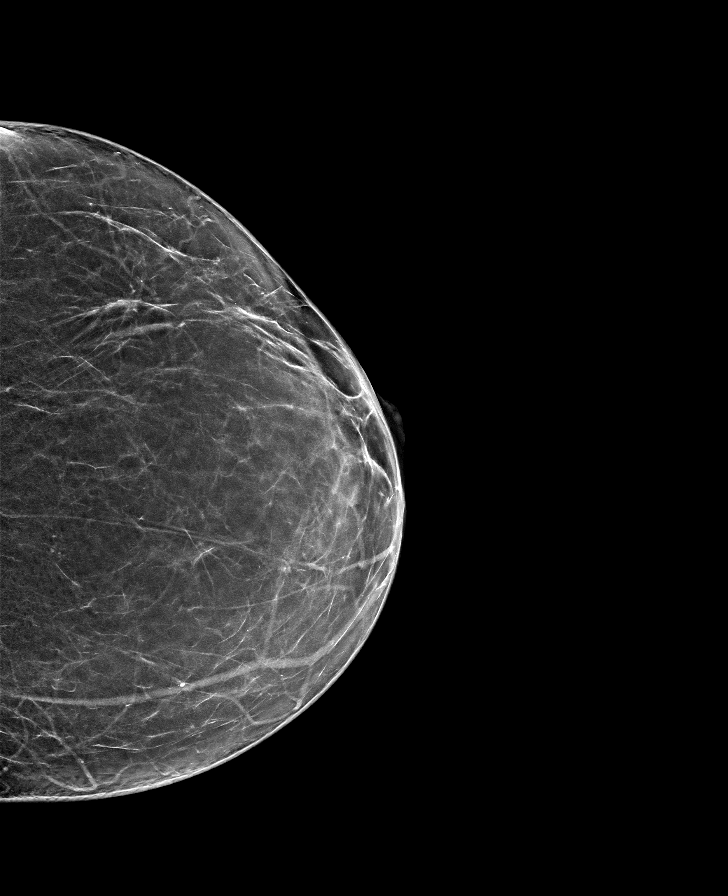

[R CC]
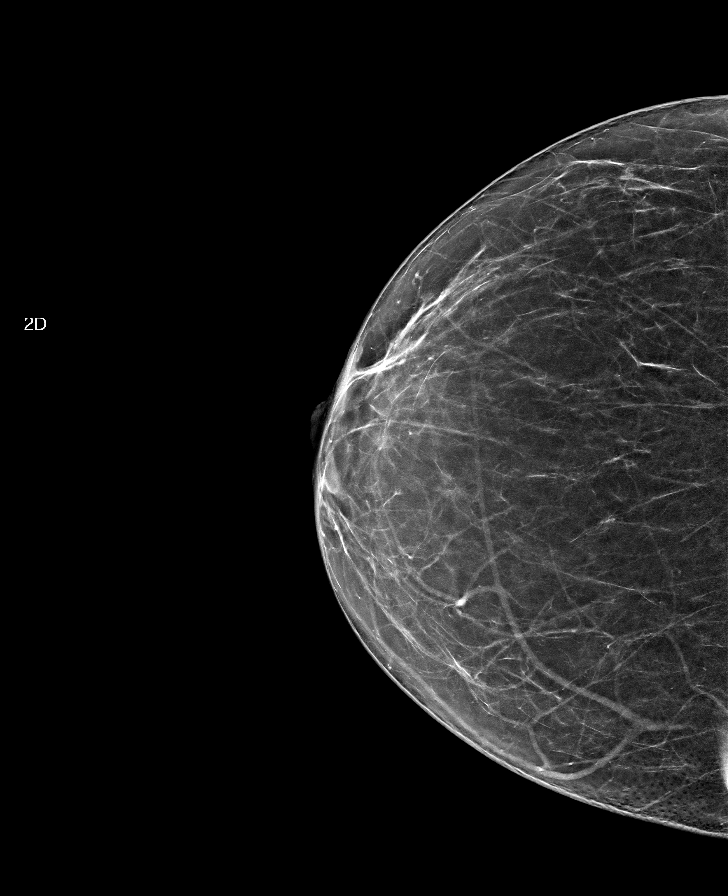

[R CC tomo · tomo slice 12/23.0]
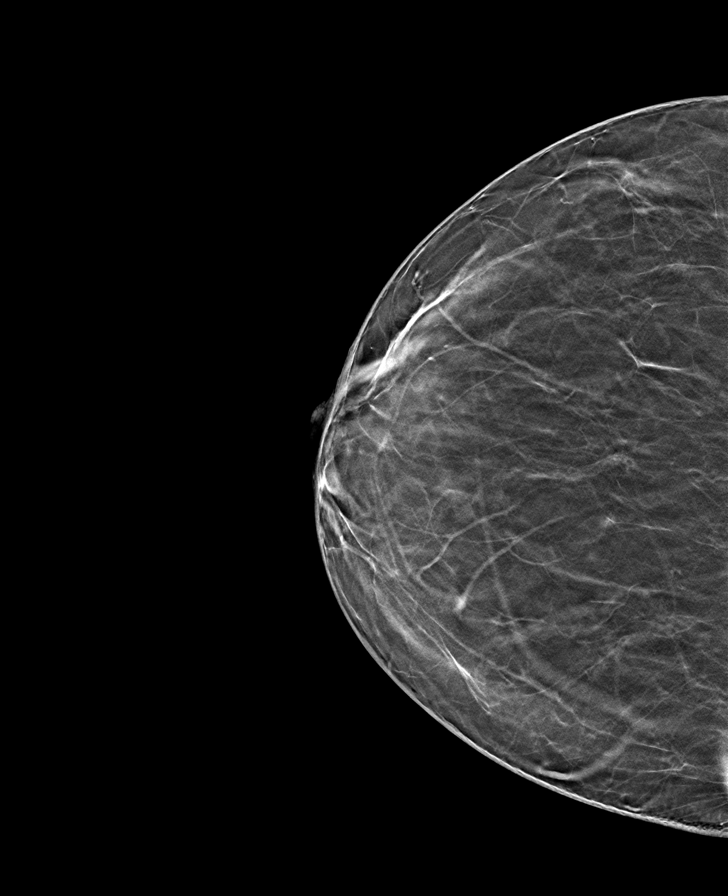

[L MLO tomo · tomo slice 15/29.0]
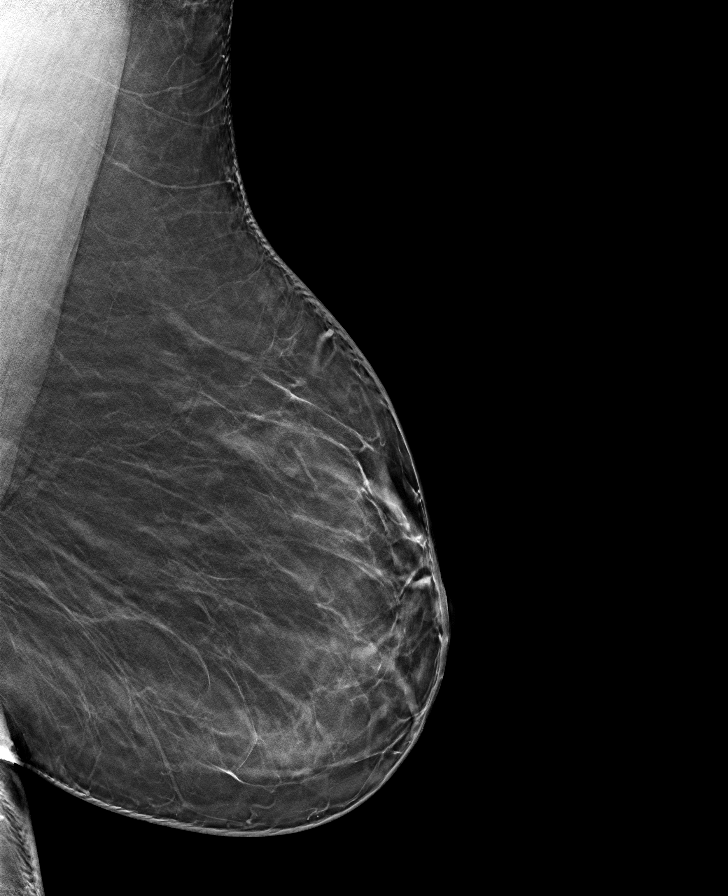

[L CC tomo · tomo slice 13/24.0]
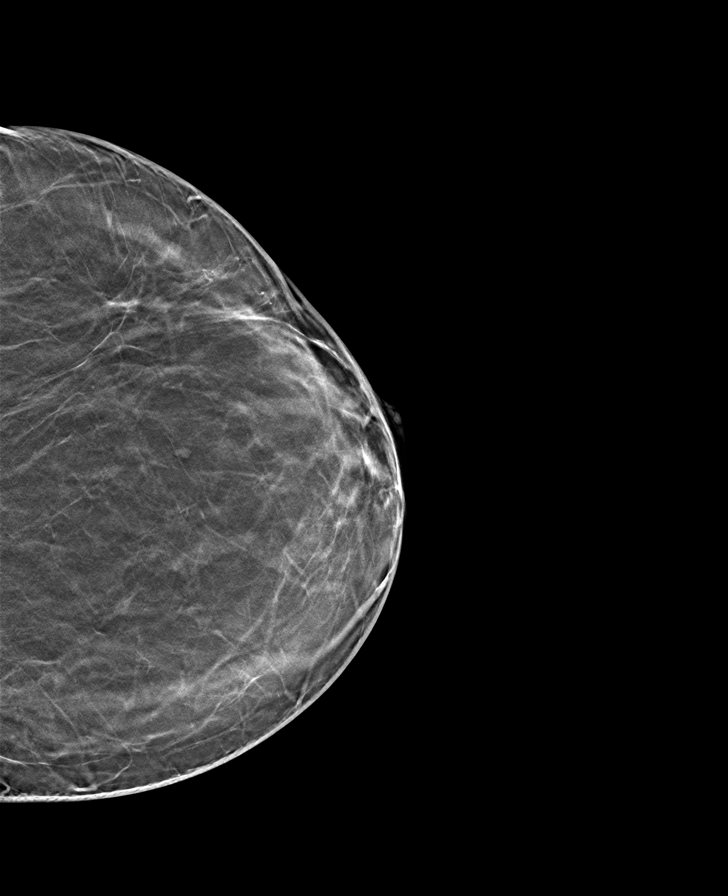

[R MLO tomo · tomo slice 15/30.0]
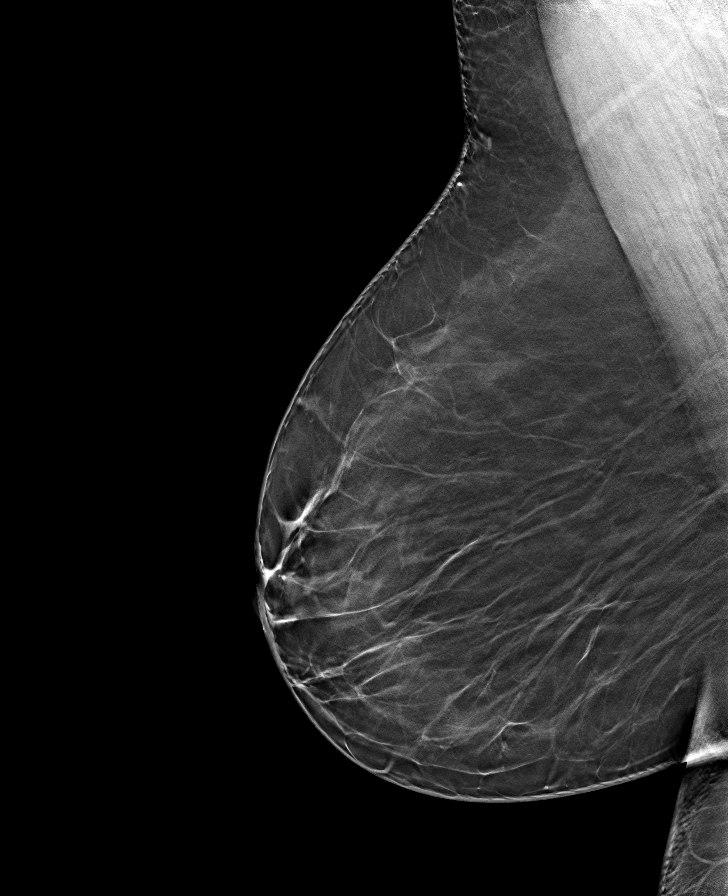

[8 of 24 positions shown; findings below may reference images not displayed]

IMPRESSION: There is no mammographic evidence of malignancy.
Screening mammogram recommended in 1 year.
BI-RADS Category 1: Negative

## 2023-02-20 ENCOUNTER — Ambulatory Visit: Admit: 2023-02-20 | Discharge: 2023-03-10 | Payer: PRIVATE HEALTH INSURANCE

## 2023-02-20 ENCOUNTER — Other Ambulatory Visit: Admit: 2023-02-20 | Payer: PRIVATE HEALTH INSURANCE

## 2023-02-20 DIAGNOSIS — Z0000 Encounter for general adult medical examination without abnormal findings: Secondary | ICD-10-CM

## 2023-02-20 LAB — COMPREHENSIVE METABOLIC PANEL
ALT: 13 U/L (ref 7–52)
AST: 17 U/L (ref 13–39)
Albumin: 4.4 g/dL (ref 3.5–5.7)
Alkaline Phosphatase: 139 U/L — ABNORMAL HIGH (ref 36–125)
Anion Gap: 7 mmol/L (ref 3–16)
BUN: 11 mg/dL (ref 7–25)
CO2: 32 mmol/L (ref 21–33)
Calcium: 10.1 mg/dL (ref 8.6–10.3)
Chloride: 104 mmol/L (ref 98–110)
Creatinine: 0.83 mg/dL (ref 0.60–1.30)
EGFR: 84
Glucose: 88 mg/dL (ref 70–100)
Osmolality, Calculated: 295 mosm/kg (ref 278–305)
Potassium: 4.3 mmol/L (ref 3.5–5.3)
Sodium: 143 mmol/L (ref 133–146)
Total Bilirubin: 1.4 mg/dL (ref 0.0–1.5)
Total Protein: 7 g/dL (ref 6.4–8.9)

## 2023-02-20 LAB — CBC
Hematocrit: 40 % (ref 35.0–45.0)
Hemoglobin: 13.7 g/dL (ref 11.7–15.5)
MCH: 33.2 pg — ABNORMAL HIGH (ref 27.0–33.0)
MCHC: 34.3 g/dL (ref 32.0–36.0)
MCV: 96.8 fL (ref 80.0–100.0)
MPV: 10.2 fL (ref 7.5–11.5)
Platelets: 188 10*3/uL (ref 140–400)
RBC: 4.13 10*6/uL (ref 3.80–5.10)
RDW: 13.6 % (ref 11.0–15.0)
WBC: 3.7 10*3/uL — ABNORMAL LOW (ref 3.8–10.8)

## 2023-02-20 LAB — LIPID PANEL
Cholesterol, Total: 215 mg/dL — ABNORMAL HIGH (ref 0–200)
HDL: 85 mg/dL (ref 60–92)
LDL Cholesterol: 113 mg/dL
Non-HDL Cholesterol, Calculated: 130 mg/dL — ABNORMAL HIGH (ref 0–129)
Triglycerides: 83 mg/dL (ref 10–149)

## 2023-02-20 LAB — HEMOGLOBIN A1C: Hemoglobin A1C: 5.3 % (ref 4.0–5.6)

## 2023-02-20 MED ORDER — amLODIPine (NORVASC) 5 MG tablet
5 | ORAL_TABLET | Freq: Every day | ORAL | 3 refills | Status: AC
Start: 2023-02-20 — End: ?

## 2023-02-20 NOTE — Progress Notes (Signed)
 [facility] - INTERNAL MEDICINE RESIDENT PRACTICE  Phone: [phone]    Name:  Kirsten Cook  Date of Birth: 10/15/1970   MRN: [id number]    Date of Service:  02/20/23     No chief complaint on file.    History of Present Illness:   Kirsten Cook is a

## 2023-02-20 NOTE — Progress Notes (Signed)
 I did not see or examine the patient. I discussed with the resident and agree with Betha Loa, MD's findings and plan as documented in the resident's note.    Larina Bras, MD, MD   Here to reestablish care.

## 2023-02-20 NOTE — Progress Notes (Signed)
 Reviewed recent lab work for annual visit: CBC, renal, hgb A1c all largely within normal limits. Lipid panel mildly elevated with total cholesterol 215, HDL 85, LDL 113, TG 83. ASCVD risk 4.3%.     Plan:   #Hyperlipidemia   Given ASCVD risk of 4.3%, would

## 2023-02-26 NOTE — Assessment & Plan Note (Signed)
 New diagnosis of hypertension. Will start low dose CCB. Discussed benefits of DASH diet and lifestyle modifications to assist in lowering blood pressure. Additionally counseled on proper use of home BP cuff and maintaining BP log

## 2023-04-15 NOTE — Telephone Encounter (Signed)
 After chart review, patient is classified as ELIGIBLE to be scheduled at either [facility] before scheduling the procedure), [facility], or [facility].    1) Age: 53 y.o.  2) Patient has been seen by a [facility] provider (MD, CNP, PA) in the last 3 years?  No  3) Medications:   A) Antiplatelet or Anticoagulants?    No   B) GLP-1?     No   C) Oral anti-diabetic?     No  4) Allergies:  Allergies   Allergen Reactions    Pollen Extracts      A) Drugs  No  B) Latex  No  C) Others  Yes, see above  5) Drug Abuse   No  6) Gastroenterology  A) Any symptoms or diagnosis?  No  7) Cardiovascular  A) Any symptoms or diagnosis?   Yes, Hypertension  B) Last office visit's date: 02/20/23  8) Pulmonology  A) Any symptoms or diagnosis?  No  9) Endocrine  A) Any symptoms or diagnosis?  No  10) GYN/GU/Renal  A) Any symptoms or diagnosis?  No  11) Hematology/Oncology  A) Any symptoms or diagnosis?  No  12) Neuromuscular  A) Any symptoms or diagnosis?  No  13) Anesthesia Events   No  14) Surgical History  Past Surgical History:   Procedure Laterality Date    ANTERIOR CRUCIATE LIGAMENT REPAIR      MYOMECTOMY  2013

## 2023-04-16 NOTE — Telephone Encounter (Signed)
 Left voicemail to call back to schedule SCREENING COLO procedure @ [facility] w/ ANY OF OUR PROVIDERS on my direct line [phone]    [place]

## 2024-03-26 MED ORDER — FLUTICASONE PROPIONATE 50 MCG/ACTUATION NASAL SPRAY,SUSPENSION
50 | Freq: Every day | NASAL | 0 refills | 60.00000 days | Status: AC
Start: 2024-03-26 — End: 2024-04-09

## 2024-03-26 MED ORDER — AMOXICILLIN 500 MG CAPSULE
500 | ORAL_CAPSULE | Freq: Three times a day (TID) | ORAL | 0 refills | 5.00000 days | Status: AC
Start: 2024-03-26 — End: 2024-04-02

## 2024-03-26 NOTE — Telephone Encounter (Signed)
 Patient called regarding symptoms over recent days consistent with sinus infection. Initial symptoms started around 10 days ago, and had begun to improve, but over the last 72 hours have returned and are worse than before. She reports pain in her maxillary area with pressure like sensation, rhinorrhea, and coughing. She also endorses sinus headache that improved some with advil. She reports having a deviated septum and has had sinus infections in the past. She denies any fevers/chills.     She reports no prior allergy to penicillin, so discussed over the phone starting a 7 day course of amoxicillin . Advised to call and schedule an appointment in the clinic next week for evaluation if symptoms worsen or are not resolving. Additionally recommended tylenol and flonase  for symptomatic relief, and adequate hydration. All questions answered.

## 2024-03-26 NOTE — Telephone Encounter (Signed)
 MRN: [id number]   Specialty: PCP RONA)    Patient Name: Kirsten Cook     Patient Date of Birth: 30-Oct-1970     Relationship of Caller to Patient and Callback: SELF [phone]    Patient of: DR. GAYLAN Hawks of Call:     PT  HAS SINUS INFECTION. PLEASE ADVISE       On Call Provider Contacted: DR. ARTHURINE    Time and Method of Contact: PAGE @4 :52PM    Advise Caller: If provider does not call back within 30 minutes, please call us  back.    ROUTE TELEPHONE NOTE - Follow Qgenda and/or Route Directly to Provider.    COPY this note into AFTERHOURS Teams chat.
